# Patient Record
Sex: Female | Born: 1970 | Race: White | Hispanic: No | Marital: Married | Smoking: Current every day smoker
Health system: Southern US, Community
[De-identification: ages and names within clinical notes are randomized; demographics above are authoritative.]

## PROBLEM LIST (undated history)

## (undated) DIAGNOSIS — R51 Headache: Secondary | ICD-10-CM

## (undated) DIAGNOSIS — M069 Rheumatoid arthritis, unspecified: Secondary | ICD-10-CM

## (undated) DIAGNOSIS — M79601 Pain in right arm: Secondary | ICD-10-CM

## (undated) DIAGNOSIS — S060XAA Concussion with loss of consciousness status unknown, initial encounter: Secondary | ICD-10-CM

## (undated) DIAGNOSIS — K219 Gastro-esophageal reflux disease without esophagitis: Secondary | ICD-10-CM

## (undated) DIAGNOSIS — M79604 Pain in right leg: Secondary | ICD-10-CM

## (undated) DIAGNOSIS — R208 Other disturbances of skin sensation: Secondary | ICD-10-CM

## (undated) DIAGNOSIS — R519 Headache, unspecified: Secondary | ICD-10-CM

## (undated) DIAGNOSIS — M549 Dorsalgia, unspecified: Secondary | ICD-10-CM

## (undated) DIAGNOSIS — Z8711 Personal history of peptic ulcer disease: Secondary | ICD-10-CM

## (undated) DIAGNOSIS — Z8719 Personal history of other diseases of the digestive system: Secondary | ICD-10-CM

## (undated) DIAGNOSIS — M542 Cervicalgia: Secondary | ICD-10-CM

## (undated) HISTORY — DX: Pain in right leg: M79.604

## (undated) HISTORY — DX: Headache, unspecified: R51.9

## (undated) HISTORY — DX: Gastro-esophageal reflux disease without esophagitis: K21.9

## (undated) HISTORY — PX: OTHER SURGICAL HISTORY: SHX169

## (undated) HISTORY — DX: Concussion with loss of consciousness status unknown, initial encounter: S06.0XAA

## (undated) HISTORY — DX: Other disturbances of skin sensation: R20.8

## (undated) HISTORY — DX: Rheumatoid arthritis, unspecified: M06.9

## (undated) HISTORY — PX: COLONOSCOPY: SHX174

## (undated) HISTORY — DX: Headache: R51

## (undated) HISTORY — DX: Dorsalgia, unspecified: M54.9

## (undated) HISTORY — DX: Personal history of peptic ulcer disease: Z87.11

## (undated) HISTORY — DX: Cervicalgia: M54.2

## (undated) HISTORY — DX: Pain in right arm: M79.601

## (undated) HISTORY — DX: Personal history of other diseases of the digestive system: Z87.19

---

## 2013-06-24 ENCOUNTER — Encounter (INDEPENDENT_AMBULATORY_CARE_PROVIDER_SITE_OTHER): Payer: Self-pay

## 2013-06-24 ENCOUNTER — Ambulatory Visit (INDEPENDENT_AMBULATORY_CARE_PROVIDER_SITE_OTHER): Payer: BC Managed Care – PPO | Admitting: Neurology

## 2013-06-24 ENCOUNTER — Encounter: Payer: Self-pay | Admitting: Neurology

## 2013-06-24 VITALS — BP 111/68 | HR 70 | Ht 61.0 in | Wt 177.0 lb

## 2013-06-24 DIAGNOSIS — M79609 Pain in unspecified limb: Secondary | ICD-10-CM

## 2013-06-24 DIAGNOSIS — R208 Other disturbances of skin sensation: Secondary | ICD-10-CM | POA: Insufficient documentation

## 2013-06-24 DIAGNOSIS — M79604 Pain in right leg: Secondary | ICD-10-CM | POA: Insufficient documentation

## 2013-06-24 DIAGNOSIS — M549 Dorsalgia, unspecified: Secondary | ICD-10-CM | POA: Insufficient documentation

## 2013-06-24 DIAGNOSIS — R209 Unspecified disturbances of skin sensation: Secondary | ICD-10-CM

## 2013-06-24 MED ORDER — GABAPENTIN 300 MG PO CAPS: 600.0000 mg | ORAL_CAPSULE | Freq: Three times a day (TID) | ORAL | Status: DC

## 2013-06-24 NOTE — Progress Notes (Signed)
PATIENT: Maria Bailey DOB: June 03, 1970  HISTORICAL  Maria Bailey is a 43 years old right-handed Caucasian female, referred to orthopedic surgeon Dr. Jaynee Eagles, and primary care physician Dr. Cathi Roan for evaluation of right shoulder pain, and right-sided discomfort  She complains of 2 weeks history of sudden onset right-sided pain, in early may 2015, she woke up from sleep in pain, she complains of pain starting from her right hand, traveling through her right arm, to her right neck, later also noticed pain radiating to her right chest, right flank, then travelling through her right leg, she is in tears because of the pain during today's interview.  She has gradual worsening increased discomfort and pain over the past 2 weeks, complains needle prick pressure pain, she also has subjective weakness, she has trouble using utensils now by her right hand because of the pain and the weakness, has to use her left hand to feed herself  She was recently evaluated by Dr. Elizabeth Sauer office May in 18 2015, examination showed limited range of motion of cervical spine, she complains of decreased sensation on the right lateral face, and right chain, shoulder examination showed full range of motion, no signs of adhesive capsulitis, laboratory evaluation 4 CBC, ESR, C-reactive protein, ANA, rheumatoid factor, fibrin degrade d-dimer, I do not have anteroapically report, patient, only abnormality is elevated WBC, was not sure the etiology.  Sureness getting a prednisone Dosepak, also taking hydrocodone, gabapentin 300 mg 3 times a day, Flexeril, without helping her pain,  REVIEW OF SYSTEMS: Full 14 system review of systems performed and notable only for weight gain, chest pain, swelling in legs, blurry vision, double vision, shortness of breath, cough, anemia, increased thirst, joint pain, joint swelling, cramps, achy muscles, numbness, weakness, slurred speech, dizziness, restless  leg   ALLERGIES: Allergies  Allergen Reactions  . Amoxicillin   . Penicillins     HOME MEDICATIONS: No current outpatient prescriptions on file prior to visit.   No current facility-administered medications on file prior to visit.    PAST MEDICAL HISTORY: Past Medical History  Diagnosis Date  . Right arm pain   . Neck pain   . Face pain   . Right leg pain   . Back pain   . Dysesthesia   . History of stomach ulcers     PAST SURGICAL HISTORY: Past Surgical History  Procedure Laterality Date  . Laparoscopic colostomy    . C sections      x2    FAMILY HISTORY: Family History  Problem Relation Age of Onset  . Cancer Maternal Grandmother   . Cancer Maternal Grandfather   . Diabetes Father   . Fibromyalgia Sister     SOCIAL HISTORY:  History   Social History  . Marital Status: Unknown    Spouse Name: N/A    Number of Children: 2  . Years of Education: N/A   Occupational History  . Furniture preparation   Social History Main Topics  . Smoking status: Current Every Day Smoker  . Smokeless tobacco: Never Used  . Alcohol Use: 1.2 oz/week    2 Glasses of wine per week     Comment: rare  . Drug Use: No  . Sexual Activity: Not on file   Other Topics Concern  . Not on file   Social History Narrative  . No narrative on file     PHYSICAL EXAM   Filed Vitals:   06/24/13 0836  BP: 111/68  Pulse: 70  Height: _0  (1.549 m)  Weight: 177 lb (80.287 kg)    Not recorded    Body mass index is 33.46 kg/(m^2).   Generalized: In no acute distress  Neck: Supple, no carotid bruits   Cardiac: Regular rate rhythm  Pulmonary: Clear to auscultation bilaterally  Musculoskeletal: No deformity  Neurological examination  Mentation:  Tearful  during today's interview,oriented to time, place, history taking, and causual conversation  Cranial nerve II-XII: Pupils were equal round reactive to light. Extraocular movements were full.  Visual field were  full on confrontational test. Bilateral fundi were sharp.  Facial sensation and strength were normal. Hearing was intact to finger rubbing bilaterally. Uvula tongue midline.  Head turning and shoulder shrug and were normal and symmetric.Tongue protrusion into cheek strength was normal.  Motor: Variable effort during examination, complains of right arm pain, I do not see any significant weakness, she complains of tenderness of her right arm upon deep palpation  Sensory: Intact to fine touch, pinprick, preserved vibratory sensation, and proprioception at toes.  Coordination: Normal finger to nose, heel-to-shin bilaterally there was no truncal ataxia  Gait: Rising up from seated position without assistance, normal stance, without trunk ataxia, moderate stride, good arm swing, smooth turning, able to perform tiptoe, and heel walking without difficulty.   Romberg signs: Negative  Deep tendon reflexes: Brachioradialis 2/2, biceps 2/2, triceps 2/2, patellar 2/2, Achilles 2/2, plantar responses were flexor bilaterally.   DIAGNOSTIC DATA (LABS, IMAGING, TESTING) - I reviewed patient records, labs, notes, testing and imaging myself where available.  ASSESSMENT AND PLAN  [name] is a 43 y.o. female complains of  sudden onset of right-sided pain, essentially normal neurological examination,  1, with her complains of pain involving right face, whole right side of her body, localization could be left hemisphere, vs. right cervical cord, 2. Proceed with MRI of brain, cervical cord, 3. EMG nerve conduction study 4. Venous Doppler study of right arm to rule out right upper extremity DVT 5 increase her gabapentin to 300 mg 2 tablets 3 times a day.    Marcial Pacas, M.D. Ph.D.  [facility] [address], [place], [place] [address] [phone] but there is a

## 2013-07-01 ENCOUNTER — Encounter: Payer: BC Managed Care – PPO | Admitting: Neurology

## 2015-09-02 ENCOUNTER — Emergency Department (HOSPITAL_COMMUNITY): Payer: 59

## 2015-09-02 ENCOUNTER — Emergency Department (HOSPITAL_COMMUNITY)
Admission: EM | Admit: 2015-09-02 | Discharge: 2015-09-03 | Disposition: A | Discharge status: Home / Self Care | Payer: 59 | Attending: Emergency Medicine | Admitting: Emergency Medicine

## 2015-09-02 ENCOUNTER — Encounter (HOSPITAL_COMMUNITY): Payer: Self-pay

## 2015-09-02 DIAGNOSIS — S301XXA Contusion of abdominal wall, initial encounter: Secondary | ICD-10-CM | POA: Diagnosis not present

## 2015-09-02 DIAGNOSIS — Y9289 Other specified places as the place of occurrence of the external cause: Secondary | ICD-10-CM | POA: Diagnosis not present

## 2015-09-02 DIAGNOSIS — W228XXA Striking against or struck by other objects, initial encounter: Secondary | ICD-10-CM | POA: Diagnosis not present

## 2015-09-02 DIAGNOSIS — Y9389 Activity, other specified: Secondary | ICD-10-CM | POA: Insufficient documentation

## 2015-09-02 DIAGNOSIS — Z79899 Other long term (current) drug therapy: Secondary | ICD-10-CM | POA: Diagnosis not present

## 2015-09-02 DIAGNOSIS — Y990 Civilian activity done for income or pay: Secondary | ICD-10-CM | POA: Insufficient documentation

## 2015-09-02 DIAGNOSIS — F17200 Nicotine dependence, unspecified, uncomplicated: Secondary | ICD-10-CM | POA: Insufficient documentation

## 2015-09-02 DIAGNOSIS — S3991XA Unspecified injury of abdomen, initial encounter: Secondary | ICD-10-CM | POA: Diagnosis present

## 2015-09-02 LAB — CBC
HEMATOCRIT: 36.8 % (ref 36.0–46.0)
HEMOGLOBIN: 11.6 g/dL — AB (ref 12.0–15.0)
MCH: 27.4 pg (ref 26.0–34.0)
MCHC: 31.5 g/dL (ref 30.0–36.0)
MCV: 86.8 fL (ref 78.0–100.0)
Platelets: 289 10*3/uL (ref 150–400)
RBC: 4.24 MIL/uL (ref 3.87–5.11)
RDW: 14.5 % (ref 11.5–15.5)
WBC: 12.1 10*3/uL — AB (ref 4.0–10.5)

## 2015-09-02 LAB — LIPASE, BLOOD: Lipase: 32 U/L (ref 11–51)

## 2015-09-02 LAB — COMPREHENSIVE METABOLIC PANEL
ALT: 10 U/L — ABNORMAL LOW (ref 14–54)
ANION GAP: 6 (ref 5–15)
AST: 13 U/L — ABNORMAL LOW (ref 15–41)
Albumin: 3.4 g/dL — ABNORMAL LOW (ref 3.5–5.0)
Alkaline Phosphatase: 57 U/L (ref 38–126)
BILIRUBIN TOTAL: 0.2 mg/dL — AB (ref 0.3–1.2)
BUN: 13 mg/dL (ref 6–20)
CHLORIDE: 107 mmol/L (ref 101–111)
CO2: 24 mmol/L (ref 22–32)
Calcium: 9.4 mg/dL (ref 8.9–10.3)
Creatinine, Ser: 1.1 mg/dL — ABNORMAL HIGH (ref 0.44–1.00)
Glucose, Bld: 105 mg/dL — ABNORMAL HIGH (ref 65–99)
POTASSIUM: 4.1 mmol/L (ref 3.5–5.1)
Sodium: 137 mmol/L (ref 135–145)
TOTAL PROTEIN: 6 g/dL — AB (ref 6.5–8.1)

## 2015-09-02 MED ORDER — FENTANYL CITRATE (PF) 100 MCG/2ML IJ SOLN: 50.0000 ug | Freq: Once | INTRAMUSCULAR | Status: DC

## 2015-09-02 MED ORDER — IOPAMIDOL (ISOVUE-300) INJECTION 61%
INTRAVENOUS | Status: AC
  Administered 2015-09-02: 100 mL
  Filled 2015-09-02: qty 100

## 2015-09-02 NOTE — ED Provider Notes (Signed)
[facility]-EMERGENCY DEPT Provider Note   CSN: [id number] Arrival date & time: 09/02/15  1954  First Provider Contact:  First MD Initiated Contact with Patient 09/02/15 2136        History   Chief Complaint Chief Complaint  Patient presents with  . Abdominal Pain    HPI Maria Bailey is a 45 y.o. female.  The history is provided by the patient and a significant other. No language interpreter was used.  Abdominal Pain   This is a new problem. The current episode started yesterday. The problem occurs constantly. The problem has been gradually worsening. The pain is associated with trauma (var fell on abdomen). The pain is located in the epigastric region, LUQ and RUQ. The quality of the pain is sharp. The pain is moderate. Pertinent negatives include anorexia, fever, diarrhea, hematochezia, melena, nausea, vomiting and hematuria. The symptoms are aggravated by certain positions, deep breathing, activity and palpation. Relieved by: rest. Past workup comments: none. Her past medical history does not include PUD, gallstones or GERD.  Unclear how long patient was pinned under vehicle. Likely about 10 to 15 min.  Past Medical History:  Diagnosis Date  . Back pain   . Dysesthesia   . Face pain   . History of stomach ulcers   . Neck pain   . Right arm pain   . Right leg pain     Patient Active Problem List   Diagnosis Date Noted  . Right leg pain   . Back pain   . Dysesthesia     Past Surgical History:  Procedure Laterality Date  . c sections     x2  . COLONOSCOPY      OB History    No data available       Home Medications    Prior to Admission medications   Medication Sig Start Date End Date Taking? Authorizing Provider  acetaminophen (TYLENOL) 500 MG tablet Take 1,500 mg by mouth daily as needed (pain).   Yes Historical Provider, MD  pantoprazole (PROTONIX) 40 MG tablet Take 40 mg by mouth 2 (two) times daily.   Yes Historical Provider, MD  PRESCRIPTION MEDICATION  Take 1 tablet by mouth See admin instructions. Take 1 tablet by mouth daily on work days - low dose diet pill to keep her awake   Yes Historical Provider, MD  gabapentin (NEURONTIN) 300 MG capsule Take 2 capsules (600 mg total) by mouth 3 (three) times daily. Patient not taking: Reported on 09/02/2015 06/24/13   Levert Feinstein, MD    Family History Family History  Problem Relation Age of Onset  . Diabetes Father   . Cancer Maternal Grandmother   . Cancer Maternal Grandfather   . Fibromyalgia Sister     Social History Social History  Substance Use Topics  . Smoking status: Current Every Day Smoker  . Smokeless tobacco: Never Used  . Alcohol use 1.2 oz/week    2 Glasses of wine per week     Comment: rare     Allergies   Amoxicillin and Penicillins   Review of Systems Review of Systems  Constitutional: Negative for fever.  HENT: Negative for trouble swallowing.   Eyes: Negative for visual disturbance.  Respiratory: Negative for shortness of breath.   Cardiovascular: Negative for chest pain.  Gastrointestinal: Positive for abdominal distention and abdominal pain. Negative for anorexia, diarrhea, hematochezia, melena, nausea and vomiting.  Genitourinary: Negative for hematuria.  Musculoskeletal: Positive for back pain (chronic, no new pain).  Skin:  Negative for pallor.  Neurological: Negative for syncope and light-headedness.  Hematological: Does not bruise/bleed easily.  Psychiatric/Behavioral: Negative for agitation and confusion.     Physical Exam Updated Vital Signs BP (!) 111/53 (BP Location: Right Arm)   Pulse 67   Temp 98.5 F (36.9 C) (Oral)   Resp 14   Ht 5\' 2"  (1.575 m)   Wt 83.9 kg   SpO2 99%   BMI 33.84 kg/m   Physical Exam  Constitutional: She is oriented to person, place, and time. She appears well-developed and well-nourished. She appears distressed (mild to mod).  HENT:  Head: Normocephalic and atraumatic.  Eyes: Conjunctivae are normal. Right eye  exhibits no discharge. Left eye exhibits no discharge.  Neck: Normal range of motion. Neck supple. No tracheal deviation present.  Cardiovascular: Normal rate, regular rhythm, normal heart sounds and intact distal pulses.   No murmur heard. Pulmonary/Chest: Effort normal and breath sounds normal. No stridor. No respiratory distress. She has no wheezes. She has no rales.  Abdominal: Soft. Bowel sounds are normal. She exhibits distension (mild). She exhibits no mass. There is tenderness (along upper half of abdomen). There is no rebound and no guarding.  Musculoskeletal: She exhibits no edema, tenderness or deformity.  Neurological: She is alert and oriented to person, place, and time.  Skin: Skin is warm and dry. Capillary refill takes less than 2 seconds. She is not diaphoretic.  Psychiatric: She has a normal mood and affect. Her behavior is normal. Judgment and thought content normal.  Nursing note and vitals reviewed.    ED Treatments / Results  Labs (all labs ordered are listed, but only abnormal results are displayed) Labs Reviewed  COMPREHENSIVE METABOLIC PANEL - Abnormal; Notable for the following:       Result Value   Glucose, Bld 105 (*)    Creatinine, Ser 1.10 (*)    Total Protein 6.0 (*)    Albumin 3.4 (*)    AST 13 (*)    ALT 10 (*)    Total Bilirubin 0.2 (*)    All other components within normal limits  CBC - Abnormal; Notable for the following:    WBC 12.1 (*)    Hemoglobin 11.6 (*)    All other components within normal limits  LIPASE, BLOOD    EKG  EKG Interpretation None       Radiology Ct Abdomen Pelvis W Contrast  Result Date: 09/02/2015 CLINICAL DATA:  Abdominal pain after injury. Patient was working under car, jack fell and car landed on mid abdomen, on abdomen for 2 minutes. EXAM: CT ABDOMEN AND PELVIS WITH CONTRAST TECHNIQUE: Multidetector CT imaging of the abdomen and pelvis was performed using the standard protocol following bolus administration of  intravenous contrast. CONTRAST:  ISOVUE-300 IOPAMIDOL (ISOVUE-300) INJECTION 61% COMPARISON:  None. FINDINGS: Lower chest: The included lung bases are clear. No pleural fluid. No pneumothorax. Included ribs are intact. Liver: Homogeneous attenuation without traumatic injury. No focal lesion. Hepatobiliary: Gallbladder physiologically distended, no calcified stone. No biliary dilatation. Pancreas: No ductal dilatation or inflammation. No evidence of traumatic injury. Spleen: Normal. Homogeneous in density, no traumatic injury. Small splenule inferiorly. Adrenal glands: No hemorrhage or nodule. Kidneys: Symmetric renal enhancement. No hydronephrosis. No traumatic injury. Subcentimeter hypodensity in the lower right kidney, likely simple cyst, too small to accurately characterize. Stomach/Bowel: Stomach physiologically distended. There are no dilated or thickened small bowel loops. Small volume of stool throughout the colon without colonic wall thickening. Minimal colonic diverticulosis without diverticulitis.  The appendix is normal. No mesenteric hematoma. Vascular/Lymphatic: No retroperitoneal fluid or adenopathy. Abdominal aorta is normal in caliber. Reproductive: Uterus slightly bulbous in appearance, likely due to fibroids. Cystic change in both ovaries, left greater than right. Bladder: Physiologically distended, no wall thickening. Other: Faint subcutaneous edema in the anterior abdominal wall. No confluent hematoma. No free air, free fluid, or intra-abdominal fluid collection. Musculoskeletal: There are no acute or suspicious osseous abnormalities. No fracture of the bony pelvis without lumbar spine. Scattered bone islands. IMPRESSION: 1. Faint soft tissue edema in the anterior abdominal wall subcutaneous tissues. No confluent hematoma. 2. No additional acute traumatic injury to the abdomen or pelvis. 3. Incidental nontraumatic findings of colonic diverticulosis, questionable uterine fibroids and ovarian  cysts. Electronically Signed   By: Rubye Oaks M.D.   On: 09/02/2015 23:57   Procedures Procedures (including critical care time)  Medications Ordered in ED Medications  iopamidol (ISOVUE-300) 61 % injection (100 mLs  Contrast Given 09/02/15 2324)     Initial Impression / Assessment and Plan / ED Course  I have reviewed the triage vital signs and the nursing notes.  Pertinent labs & imaging results that were available during my care of the patient were reviewed by me and considered in my medical decision making (see chart for details).  Clinical Course   Patient presented after having an injury from a significant mechanism. HDS on presentation and no signs of shock noted. ABCs intact. Patient was concerned because pain was ongoing today. Has not had any significant associated symptoms. Abd is tender along upper portion but does not have any signs consistent with peritonitis (including no bilat positive heal tap tests). Basic blood work was obtain to rule out anemia if patient had had bleeding since injury (patient had had increased abd distention but no signs of retroperitoneal bleeding were seen on physical exam. FAST was performed and was negative. LFTs and lipase were WNL. Due to mechanism and duration of pinning, ct with contrast of abd/pelvis was obtained. Soft tissue contusions were seen but no extravasation was noted and no solid organ injuries were seen either. No appreciable free fluid either. Results were discussed with patient and was recommended to managed pain with NSAIDs and rest. Work note provided to limit physical exercise. Usual and customary return precautions discussed. Encouraged follow up with PCP to monitor for improvement. All questions answered. Patient agrees with plan.   Final Clinical Impressions(s) / ED Diagnoses   Final diagnoses:  Hematoma of abdominal wall, initial encounter    New Prescriptions Discharge Medication List as of 09/03/2015 12:41 AM        Maretta Bees, MD 09/04/15 0044    Melene Plan, DO 09/04/15 1510

## 2015-09-02 NOTE — ED Triage Notes (Signed)
Pt states was changing oil underneath car. States jack moved and car fell on abdomen. Pt with + bowel sounds. Denies any other trauma/injury. Pt complaining of central abdominal pain. No obvious bruising or swelling noted.

## 2015-09-06 ENCOUNTER — Emergency Department (HOSPITAL_COMMUNITY)
Admission: EM | Admit: 2015-09-06 | Discharge: 2015-09-07 | Disposition: A | Discharge status: Home / Self Care | Payer: 59 | Attending: Emergency Medicine | Admitting: Emergency Medicine

## 2015-09-06 ENCOUNTER — Encounter (HOSPITAL_COMMUNITY): Payer: Self-pay | Admitting: Emergency Medicine

## 2015-09-06 DIAGNOSIS — Y999 Unspecified external cause status: Secondary | ICD-10-CM | POA: Diagnosis not present

## 2015-09-06 DIAGNOSIS — Y929 Unspecified place or not applicable: Secondary | ICD-10-CM | POA: Insufficient documentation

## 2015-09-06 DIAGNOSIS — R110 Nausea: Secondary | ICD-10-CM | POA: Diagnosis not present

## 2015-09-06 DIAGNOSIS — R1010 Upper abdominal pain, unspecified: Secondary | ICD-10-CM

## 2015-09-06 DIAGNOSIS — F17200 Nicotine dependence, unspecified, uncomplicated: Secondary | ICD-10-CM | POA: Insufficient documentation

## 2015-09-06 DIAGNOSIS — R1084 Generalized abdominal pain: Secondary | ICD-10-CM

## 2015-09-06 DIAGNOSIS — Y9389 Activity, other specified: Secondary | ICD-10-CM | POA: Insufficient documentation

## 2015-09-06 DIAGNOSIS — S301XXA Contusion of abdominal wall, initial encounter: Secondary | ICD-10-CM | POA: Insufficient documentation

## 2015-09-06 DIAGNOSIS — S3991XA Unspecified injury of abdomen, initial encounter: Secondary | ICD-10-CM | POA: Diagnosis present

## 2015-09-06 DIAGNOSIS — W208XXA Other cause of strike by thrown, projected or falling object, initial encounter: Secondary | ICD-10-CM | POA: Insufficient documentation

## 2015-09-06 MED ORDER — OXYCODONE-ACETAMINOPHEN 5-325 MG PO TABS
1.0000 | ORAL_TABLET | ORAL | Status: DC | PRN
  Administered 2015-09-06: 1 via ORAL

## 2015-09-06 MED ORDER — OXYCODONE-ACETAMINOPHEN 5-325 MG PO TABS
ORAL_TABLET | ORAL | Status: DC
Start: 2015-09-06 — End: 2015-09-07
  Filled 2015-09-06: qty 1

## 2015-09-06 NOTE — ED Triage Notes (Addendum)
The patient said she was trying to change the oil, the jack slipped and the car fell on her.  This happened on Saturday.  She was seen here on Sunday and was discharged home on Sunday.  The patient was told if the pain got worse and she continued to swell she should come to the ED.  She rates her pain 9/10.  She has taken tylenol and it is not working.  Patient is distended and feels a lot of pressure.  She has had BMs and denies any blood.

## 2015-09-07 ENCOUNTER — Emergency Department (HOSPITAL_COMMUNITY): Payer: 59

## 2015-09-07 LAB — CBC WITH DIFFERENTIAL/PLATELET
Basophils Absolute: 0 10*3/uL (ref 0.0–0.1)
Basophils Relative: 0 %
EOS ABS: 0.1 10*3/uL (ref 0.0–0.7)
Eosinophils Relative: 2 %
HEMATOCRIT: 36.5 % (ref 36.0–46.0)
HEMOGLOBIN: 11.6 g/dL — AB (ref 12.0–15.0)
LYMPHS ABS: 2.5 10*3/uL (ref 0.7–4.0)
LYMPHS PCT: 28 %
MCH: 27.6 pg (ref 26.0–34.0)
MCHC: 31.8 g/dL (ref 30.0–36.0)
MCV: 86.9 fL (ref 78.0–100.0)
MONOS PCT: 3 %
Monocytes Absolute: 0.3 10*3/uL (ref 0.1–1.0)
NEUTROS PCT: 67 %
Neutro Abs: 6 10*3/uL (ref 1.7–7.7)
Platelets: 266 10*3/uL (ref 150–400)
RBC: 4.2 MIL/uL (ref 3.87–5.11)
RDW: 14.5 % (ref 11.5–15.5)
WBC: 8.9 10*3/uL (ref 4.0–10.5)

## 2015-09-07 LAB — URINALYSIS, ROUTINE W REFLEX MICROSCOPIC
BILIRUBIN URINE: NEGATIVE
GLUCOSE, UA: NEGATIVE mg/dL
HGB URINE DIPSTICK: NEGATIVE
Ketones, ur: NEGATIVE mg/dL
Leukocytes, UA: NEGATIVE
Nitrite: NEGATIVE
PH: 5.5 (ref 5.0–8.0)
Protein, ur: NEGATIVE mg/dL
SPECIFIC GRAVITY, URINE: 1.019 (ref 1.005–1.030)

## 2015-09-07 LAB — COMPREHENSIVE METABOLIC PANEL
ALBUMIN: 3.4 g/dL — AB (ref 3.5–5.0)
ALT: 8 U/L — AB (ref 14–54)
AST: 13 U/L — AB (ref 15–41)
Alkaline Phosphatase: 52 U/L (ref 38–126)
Anion gap: 6 (ref 5–15)
BILIRUBIN TOTAL: 0.5 mg/dL (ref 0.3–1.2)
BUN: 13 mg/dL (ref 6–20)
CHLORIDE: 110 mmol/L (ref 101–111)
CO2: 22 mmol/L (ref 22–32)
CREATININE: 1.07 mg/dL — AB (ref 0.44–1.00)
Calcium: 8.2 mg/dL — ABNORMAL LOW (ref 8.9–10.3)
GFR calc Af Amer: >60 mL/min (ref >60)
GFR calc non Af Amer: >60 mL/min (ref >60)
GLUCOSE: 82 mg/dL (ref 65–99)
POTASSIUM: 3.7 mmol/L (ref 3.5–5.1)
Sodium: 138 mmol/L (ref 135–145)
Total Protein: 5.9 g/dL — ABNORMAL LOW (ref 6.5–8.1)

## 2015-09-07 LAB — POC URINE PREG, ED: Preg Test, Ur: NEGATIVE

## 2015-09-07 LAB — I-STAT CG4 LACTIC ACID, ED: Lactic Acid, Venous: <0.3 mmol/L — ABNORMAL LOW (ref 0.5–1.9)

## 2015-09-07 MED ORDER — MORPHINE SULFATE (PF) 4 MG/ML IV SOLN
4.0000 mg | Freq: Once | INTRAVENOUS | Status: AC
  Administered 2015-09-07: 4 mg via INTRAVENOUS
  Filled 2015-09-07: qty 1

## 2015-09-07 MED ORDER — ONDANSETRON HCL 4 MG/2ML IJ SOLN
4.0000 mg | Freq: Once | INTRAMUSCULAR | Status: AC
Start: 2015-09-07 — End: 2015-09-07
  Administered 2015-09-07: 4 mg via INTRAVENOUS
  Filled 2015-09-07: qty 2

## 2015-09-07 MED ORDER — SODIUM CHLORIDE 0.9 % IV BOLUS (SEPSIS)
1000.0000 mL | Freq: Once | INTRAVENOUS | Status: AC
  Administered 2015-09-07: 1000 mL via INTRAVENOUS

## 2015-09-07 MED ORDER — IOPAMIDOL (ISOVUE-300) INJECTION 61%
INTRAVENOUS | Status: AC
  Administered 2015-09-07: 100 mL
  Filled 2015-09-07: qty 100

## 2015-09-07 MED ORDER — ONDANSETRON HCL 4 MG PO TABS: 4.0000 mg | ORAL_TABLET | Freq: Four times a day (QID) | ORAL | 0 refills | Status: DC

## 2015-09-07 MED ORDER — CYCLOBENZAPRINE HCL 10 MG PO TABS: 10.0000 mg | ORAL_TABLET | Freq: Two times a day (BID) | ORAL | 0 refills | Status: DC | PRN

## 2015-09-07 MED ORDER — HYDROMORPHONE HCL 1 MG/ML IJ SOLN
1.0000 mg | Freq: Once | INTRAMUSCULAR | Status: AC
  Administered 2015-09-07: 1 mg via INTRAVENOUS
  Filled 2015-09-07: qty 1

## 2015-09-07 NOTE — ED Provider Notes (Signed)
Medical screening examination/treatment/procedure(s) were conducted as a shared visit with non-physician practitioner(s) and myself.  I personally evaluated the patient during the encounter.   EKG Interpretation None      Patient with increasing abdominal distention and increasing abdominal pain despite being 6 days out from her injury.  The mechanism is concerning given crush injury by car.  The distention is probably the component that is most concerning.  She continues to have nausea as well as some loose stools.  No blood noted in her stool.  CT scan again tonight demonstrates no intra-abdominal pathology.  Her white blood cell count is normal.  I question whether or not she could be developing subacute intra-abdominal hypertension.  At this time she does point doesn't have acute abdominal compartment syndrome however I have no great alternative pathology for why she is feeling worse and she seems extremely tender on examination.  I spoke with Dr. Gaynelle Adu of general surgery/trauma surgery whose team will evaluate the patient in the emergency department.   Azalia Bilis, MD 09/07/15 [phone]

## 2015-09-07 NOTE — ED Provider Notes (Signed)
[facility]-EMERGENCY DEPT Provider Note   CSN: [id number] Arrival date & time: 09/06/15  2010  First Provider Contact:  First MD Initiated Contact with Patient 09/07/15 0007        History   Chief Complaint Chief Complaint  Patient presents with  . Abdominal Pain    The patient said she was trying to change the oil, the jack slipped and the car fell on her.  This happened on Saturday.  She was seen here on Sunday and was discharged home on Sunday.      HPI Maria Bailey is a 45 y.o. female.  HPI    Maria Bailey is a 45 y.o. female, with a history of Peptic ulcers, presenting to the ED with upper abdominal pain and distension, increasing over the last four or five days. Pain is severe, described as intermittently pressure and sharp, nonradiating. Pain originated on July 29 when the patient was underneath a car changing the oil when the jack slipped and the car fell on top of her. Patient was seen the following day in the ED with some mild abdominal wall edema noted on CT. Pt also complains of nausea. States her abdomen has doubled in size with the largest increase in swelling occurring over the past 24 hours. Still able to have BM, urinate, and pass gas. Denies fever/chills, vomiting, hematochezia/melena, or any other complaints.      Past Medical History:  Diagnosis Date  . Back pain   . Dysesthesia   . Face pain   . History of stomach ulcers   . Neck pain   . Right arm pain   . Right leg pain     Patient Active Problem List   Diagnosis Date Noted  . Right leg pain   . Back pain   . Dysesthesia     Past Surgical History:  Procedure Laterality Date  . c sections     x2  . COLONOSCOPY      OB History    No data available       Home Medications    Prior to Admission medications   Medication Sig Start Date End Date Taking? Authorizing Provider  acetaminophen (TYLENOL) 500 MG tablet Take 1,500 mg by mouth daily as needed (pain).   Yes Historical Provider, MD    buPROPion (WELLBUTRIN XL) 150 MG 24 hr tablet Take 150 mg by mouth daily.   Yes Historical Provider, MD  pantoprazole (PROTONIX) 40 MG tablet Take 40 mg by mouth 2 (two) times daily.   Yes Historical Provider, MD    Family History Family History  Problem Relation Age of Onset  . Diabetes Father   . Cancer Maternal Grandmother   . Cancer Maternal Grandfather   . Fibromyalgia Sister     Social History Social History  Substance Use Topics  . Smoking status: Current Every Day Smoker  . Smokeless tobacco: Never Used  . Alcohol use 1.2 oz/week    2 Glasses of wine per week     Comment: rare     Allergies   Amoxicillin and Penicillins   Review of Systems Review of Systems  Constitutional: Negative for chills and fever.  Gastrointestinal: Positive for abdominal distention, abdominal pain and nausea. Negative for blood in stool and vomiting.  Genitourinary: Negative for dysuria and hematuria.  Musculoskeletal: Negative for back pain.  Skin: Negative for wound.  All other systems reviewed and are negative.    Physical Exam Updated Vital Signs BP 126/84 (BP Location:  Right Arm)   Pulse 81   Temp 98.5 F (36.9 C) (Oral)   Resp 20   LMP 08/26/2015   SpO2 97%   Physical Exam  Constitutional: She appears well-developed and well-nourished. No distress.  HENT:  Head: Normocephalic and atraumatic.  Eyes: Conjunctivae are normal.  Neck: Neck supple.  Cardiovascular: Normal rate, regular rhythm, normal heart sounds and intact distal pulses.   Pulmonary/Chest: Effort normal and breath sounds normal. No respiratory distress.  Abdominal: Soft. Bowel sounds are normal. She exhibits distension. There is tenderness (exquisite) in the right upper quadrant, epigastric area and left upper quadrant. There is no guarding.  Slight bruising to upper abdomen.  Musculoskeletal: She exhibits no edema or tenderness.  Full ROM in all extremities and spine. No midline spinal tenderness.    Lymphadenopathy:    She has no cervical adenopathy.  Neurological: She is alert. She has normal reflexes.  No sensory deficits in the lower extremities. Strength 5 out of 5.  Skin: Skin is warm and dry. She is not diaphoretic.  Psychiatric: She has a normal mood and affect. Her behavior is normal.  Nursing note and vitals reviewed.    ED Treatments / Results  Labs (all labs ordered are listed, but only abnormal results are displayed) Labs Reviewed  COMPREHENSIVE METABOLIC PANEL - Abnormal; Notable for the following:       Result Value   Creatinine, Ser 1.07 (*)    Calcium 8.2 (*)    Total Protein 5.9 (*)    Albumin 3.4 (*)    AST 13 (*)    ALT 8 (*)    All other components within normal limits  CBC WITH DIFFERENTIAL/PLATELET - Abnormal; Notable for the following:    Hemoglobin 11.6 (*)    All other components within normal limits  I-STAT CG4 LACTIC ACID, ED - Abnormal; Notable for the following:    Lactic Acid, Venous <0.30 (*)    All other components within normal limits  URINALYSIS, ROUTINE W REFLEX MICROSCOPIC (NOT AT [facility])  POC URINE PREG, ED    EKG  EKG Interpretation None      Hemoglobin  Date Value Ref Range Status  09/07/2015 11.6 (L) 12.0 - 15.0 g/dL Final  81/15/7262 03.5 (L) 12.0 - 15.0 g/dL Final    Radiology Ct Abdomen Pelvis W Contrast  Result Date: 09/07/2015 CLINICAL DATA:  Car fell on abdomen 6 days ago, with worsening pain and distention. EXAM: CT ABDOMEN AND PELVIS WITH CONTRAST TECHNIQUE: Multidetector CT imaging of the abdomen and pelvis was performed using the standard protocol following bolus administration of intravenous contrast. CONTRAST:  ISOVUE-300 IOPAMIDOL (ISOVUE-300) INJECTION 61% COMPARISON:  CT abdomen and pelvis September 02, 2015. FINDINGS: LUNG BASES: Hazy ground-glass opacities in lung bases most compatible with atelectasis. Heart size is normal. No pericardial effusions. SOLID ORGANS: The liver, spleen, gallbladder, pancreas and  adrenal glands are unremarkable. GASTROINTESTINAL TRACT: The stomach, small and large bowel are normal in course and caliber without inflammatory changes. Mild sigmoid diverticulosis. Normal appendix. KIDNEYS/ URINARY TRACT: Kidneys are orthotopic, demonstrating symmetric enhancement. No nephrolithiasis, hydronephrosis or solid renal masses. Too small to characterize hypodensities in the kidneys bilaterally. The unopacified ureters are normal in course and caliber. Delayed imaging through the kidneys demonstrates symmetric prompt contrast excretion within the proximal urinary collecting system. Urinary bladder is partially distended and unremarkable. PERITONEUM/RETROPERITONEUM: Aortoiliac vessels are normal in course and caliber. No lymphadenopathy by CT size criteria. Small lymph nodes at porta hepatis are likely reactive.  Mildly lobulated uterine contour suggest leiomyomata. No intraperitoneal free fluid nor free air. SOFT TISSUE/OSSEOUS STRUCTURES: Mild residual anterior abdominal wall subcutaneous fat stranding, no focal fluid collection. IMPRESSION: Faint residual anterior abdominal wall contusion. No acute intra-abdominal or pelvic process. Electronically Signed   By: Awilda Metro M.D.   On: 09/07/2015 03:44    Procedures Procedures (including critical care time)  Medications Ordered in ED Medications  oxyCODONE-acetaminophen (PERCOCET/ROXICET) 5-325 MG per tablet 1 tablet (1 tablet Oral Given 09/06/15 2023)  morphine 4 MG/ML injection 4 mg (4 mg Intravenous Given 09/07/15 0041)  ondansetron (ZOFRAN) injection 4 mg (4 mg Intravenous Given 09/07/15 0041)  sodium chloride 0.9 % bolus 1,000 mL (0 mLs Intravenous Stopped 09/07/15 0201)  HYDROmorphone (DILAUDID) injection 1 mg (1 mg Intravenous Given 09/07/15 0121)  sodium chloride 0.9 % bolus 1,000 mL (1,000 mLs Intravenous New Bag/Given 09/07/15 0258)  iopamidol (ISOVUE-300) 61 % injection (100 mLs  Contrast Given 09/07/15 0318)     Initial Impression /  Assessment and Plan / ED Course  I have reviewed the triage vital signs and the nursing notes.  Pertinent labs & imaging results that were available during my care of the patient were reviewed by me and considered in my medical decision making (see chart for details).  Clinical Course    Maria Bailey presents with abdominal pain Increasing over the last 4 days following an abdominal injury.  Findings and plan of care discussed with Azalia Bilis, MD. Dr. Patria Mane personally evaluated and examined this patient.  The concern for this patient is that she is at risk for abdominal compartment syndrome. The concerning factor being increasing pain with progressive abdominal distention. Upon reassessment, patient states her pain is well controlled. No notable progression in the abdominal distention. CT shows abdominal wall contusion. General surgery consult due to mechanism, distention, and amount of tenderness on exam. 4:57 AM Dr. Patria Mane spoke with Dr. Andrey Campanile, general surgeon, who agreed to have someone from his team see the patient in the ED later this morning. 6:48 AM End of shift patient care handoff report given to [employer], PA-C. Plan: Patient to be assessed by a Trauma team provider later this morning. Dispo per their instructions.  Vitals:   09/07/15 0145 09/07/15 0200 09/07/15 0215 09/07/15 0245  BP: 109/79 123/85 107/57 109/55  Pulse: 75 87 70 62  Resp: Temp:      TempSrc:      SpO2: 99% 96% (!) 87% 90%   Vitals:   09/07/15 0200 09/07/15 0215 09/07/15 0245 09/07/15 0509  BP: 123/85 107/57 109/55 111/66  Pulse: 87 70 62 60  Resp: Temp:      TempSrc:      SpO2: 96% (!) 87% 90% 98%       Final Clinical Impressions(s) / ED Diagnoses   Final diagnoses:  Pain of upper abdomen    New Prescriptions New Prescriptions   No medications on file     Concepcion Living 09/07/15 0650    Azalia Bilis, MD 09/07/15 [phone]

## 2015-09-07 NOTE — ED Notes (Signed)
Patient transported to Ct. Pt in NAD at this time.

## 2015-09-07 NOTE — Consult Note (Signed)
Reason for Consult: Abdominal wall contusion Referring Physician: Osie Merkin is an 44 y.o. female.  HPI: Jarielys was removing the oil p.m. from a car which was up on a jack last Saturday, July 29. The [place] in the car fell down striking her in the abdomen. She had immediate pain in the area but it subsided after 30 minutes or so. It did not completely resolve and she was more sore the following day prompting a trip to the emergency department for evaluation. She underwent a workup including CT scan abdomen and pelvis which showed a faint abdominal wall contusion but was otherwise negative. Since that time, she has had ongoing abdominal soreness and intermittent nausea. She contacted her primary care physician who referred her back to the emergency department. She returned last night and underwent a further evaluation which included laboratory studies which were unremarkable. Also included a repeat CT scan of her abdomen and pelvis which shows trace abdominal wall contusion and otherwise no acute traumatic abnormalities. We are asked to evaluate her for her ongoing pain. Last bowel movement was yesterday and was loose without blood.  Past Medical History:  Diagnosis Date  . Back pain   . Dysesthesia   . Face pain   . History of stomach ulcers   . Neck pain   . Right arm pain   . Right leg pain     Past Surgical History:  Procedure Laterality Date  . c sections     x2  . COLONOSCOPY      Family History  Problem Relation Age of Onset  . Diabetes Father   . Cancer Maternal Grandmother   . Cancer Maternal Grandfather   . Fibromyalgia Sister     Social History:  reports that she has been smoking.  She has never used smokeless tobacco. She reports that she drinks about 1.2 oz of alcohol per week . She reports that she does not use drugs.  Allergies:  Allergies  Allergen Reactions  . Amoxicillin Anaphylaxis and Hives  . Penicillins Anaphylaxis and Hives    Has  patient had a PCN reaction causing immediate rash, facial/tongue/throat swelling, SOB or lightheadedness with hypotension: Yes Has patient had a PCN reaction causing severe rash involving mucus membranes or skin necrosis: No Has patient had a PCN reaction that required hospitalization 10 hr hospital visit Has patient had a PCN reaction occurring within the last 10 years: No If all of the above answers are "NO", then may proceed with Cephalosporin use.    Medications: Prior to Admission:  (Not in a hospital admission)  Results for orders placed or performed during the hospital encounter of 09/06/15 (from the past 48 hour(s))  Comprehensive metabolic panel     Status: Abnormal   Collection Time: 09/07/15  1:42 AM  Result Value Ref Range   Sodium 138 135 - 145 mmol/L   Potassium 3.7 3.5 - 5.1 mmol/L   Chloride 110 101 - 111 mmol/L   CO2 22 22 - 32 mmol/L   Glucose, Bld 82 65 - 99 mg/dL   BUN 13 6 - 20 mg/dL   Creatinine, Ser 1.07 (H) 0.44 - 1.00 mg/dL   Calcium 8.2 (L) 8.9 - 10.3 mg/dL   Total Protein 5.9 (L) 6.5 - 8.1 g/dL   Albumin 3.4 (L) 3.5 - 5.0 g/dL   AST 13 (L) 15 - 41 U/L   ALT 8 (L) 14 - 54 U/L   Alkaline Phosphatase 52 38 - 126  U/L   Total Bilirubin 0.5 0.3 - 1.2 mg/dL   GFR calc non Af Amer >60 >60 mL/min   GFR calc Af Amer >60 >60 mL/min    Comment: (NOTE) The eGFR has been calculated using the CKD EPI equation. This calculation has not been validated in all clinical situations. eGFR's persistently <60 mL/min signify possible Chronic Kidney Disease.    Anion gap 6 5 - 15  CBC with Differential     Status: Abnormal   Collection Time: 09/07/15  1:42 AM  Result Value Ref Range   WBC 8.9 4.0 - 10.5 K/uL   RBC 4.20 3.87 - 5.11 MIL/uL   Hemoglobin 11.6 (L) 12.0 - 15.0 g/dL   HCT 36.5 36.0 - 46.0 %   MCV 86.9 78.0 - 100.0 fL   MCH 27.6 26.0 - 34.0 pg   MCHC 31.8 30.0 - 36.0 g/dL   RDW 14.5 11.5 - 15.5 %   Platelets 266 150 - 400 K/uL   Neutrophils Relative % 67 %     Neutro Abs 6.0 1.7 - 7.7 K/uL   Lymphocytes Relative 28 %   Lymphs Abs 2.5 0.7 - 4.0 K/uL   Monocytes Relative 3 %   Monocytes Absolute 0.3 0.1 - 1.0 K/uL   Eosinophils Relative 2 %   Eosinophils Absolute 0.1 0.0 - 0.7 K/uL   Basophils Relative 0 %   Basophils Absolute 0.0 0.0 - 0.1 K/uL  POC urine preg, ED     Status: None   Collection Time: 09/07/15  2:07 AM  Result Value Ref Range   Preg Test, Ur NEGATIVE NEGATIVE    Comment:        THE SENSITIVITY OF THIS METHODOLOGY IS >24 mIU/mL   Urinalysis, Routine w reflex microscopic     Status: None   Collection Time: 09/07/15  2:41 AM  Result Value Ref Range   Color, Urine YELLOW YELLOW   APPearance CLEAR CLEAR   Specific Gravity, Urine 1.019 1.005 - 1.030   pH 5.5 5.0 - 8.0   Glucose, UA NEGATIVE NEGATIVE mg/dL   Hgb urine dipstick NEGATIVE NEGATIVE   Bilirubin Urine NEGATIVE NEGATIVE   Ketones, ur NEGATIVE NEGATIVE mg/dL   Protein, ur NEGATIVE NEGATIVE mg/dL   Nitrite NEGATIVE NEGATIVE   Leukocytes, UA NEGATIVE NEGATIVE    Comment: MICROSCOPIC NOT DONE ON URINES WITH NEGATIVE PROTEIN, BLOOD, LEUKOCYTES, NITRITE, OR GLUCOSE <1000 mg/dL.  I-Stat CG4 Lactic Acid, ED     Status: Abnormal   Collection Time: 09/07/15  5:02 AM  Result Value Ref Range   Lactic Acid, Venous <0.30 (L) 0.5 - 1.9 mmol/L    Ct Abdomen Pelvis W Contrast  Result Date: 09/07/2015 CLINICAL DATA:  Car fell on abdomen 6 days ago, with worsening pain and distention. EXAM: CT ABDOMEN AND PELVIS WITH CONTRAST TECHNIQUE: Multidetector CT imaging of the abdomen and pelvis was performed using the standard protocol following bolus administration of intravenous contrast. CONTRAST:  165m ISOVUE-300 IOPAMIDOL (ISOVUE-300) INJECTION 61% COMPARISON:  CT abdomen and pelvis September 02, 2015. FINDINGS: LUNG BASES: Hazy ground-glass opacities in lung bases most compatible with atelectasis. Heart size is normal. No pericardial effusions. SOLID ORGANS: The liver, spleen,  gallbladder, pancreas and adrenal glands are unremarkable. GASTROINTESTINAL TRACT: The stomach, small and large bowel are normal in course and caliber without inflammatory changes. Mild sigmoid diverticulosis. Normal appendix. KIDNEYS/ URINARY TRACT: Kidneys are orthotopic, demonstrating symmetric enhancement. No nephrolithiasis, hydronephrosis or solid renal masses. Too small to characterize hypodensities in the kidneys  bilaterally. The unopacified ureters are normal in course and caliber. Delayed imaging through the kidneys demonstrates symmetric prompt contrast excretion within the proximal urinary collecting system. Urinary bladder is partially distended and unremarkable. PERITONEUM/RETROPERITONEUM: Aortoiliac vessels are normal in course and caliber. No lymphadenopathy by CT size criteria. Small lymph nodes at porta hepatis are likely reactive. Mildly lobulated uterine contour suggest leiomyomata. No intraperitoneal free fluid nor free air. SOFT TISSUE/OSSEOUS STRUCTURES: Mild residual anterior abdominal wall subcutaneous fat stranding, no focal fluid collection. IMPRESSION: Faint residual anterior abdominal wall contusion. No acute intra-abdominal or pelvic process. Electronically Signed   By: Elon Alas M.D.   On: 09/07/2015 03:44    Review of Systems  Constitutional: Negative for fever.  Eyes: Negative for blurred vision and double vision.  Respiratory: Negative for cough and shortness of breath.   Cardiovascular: Negative for chest pain.  Gastrointestinal: Positive for abdominal pain, diarrhea and nausea. Negative for blood in stool, melena and vomiting.  Genitourinary: Negative.   Musculoskeletal:       See history of present illness  Skin: Negative.   Neurological: Negative for focal weakness, loss of consciousness and headaches.  Endo/Heme/Allergies: Negative.   Psychiatric/Behavioral: Negative.    Blood pressure 98/60, pulse (!) 58, temperature 98.5 F (36.9 C), temperature  source Oral, resp. rate 11, last menstrual period 08/26/2015, SpO2 99 %. Physical Exam  Constitutional: She is oriented to person, place, and time. She appears well-developed and well-nourished. No distress.  HENT:  Head: Normocephalic.  Right Ear: External ear normal.  Left Ear: External ear normal.  Nose: Nose normal.  Mouth/Throat: Oropharynx is clear and moist.  Eyes: Conjunctivae and EOM are normal. Pupils are equal, round, and reactive to light.  Neck: No tracheal deviation present.  No posterior midline tenderness  Cardiovascular: Normal rate, regular rhythm, normal heart sounds and intact distal pulses.   Respiratory: Effort normal and breath sounds normal. No stridor. No respiratory distress. She has no wheezes. She has no rales. She exhibits no tenderness.  GI: Soft. She exhibits no distension. There is tenderness. There is no rebound and no guarding.    Tenderness in the area of small abdominal contusion which is evolving. No generalized tenderness. No guarding. No peritonitis. Bowel sounds are normal.  Musculoskeletal: Normal range of motion. She exhibits no deformity.  Neurological: She is alert and oriented to person, place, and time. She exhibits normal muscle tone.  Skin: Skin is warm.  Multiple tattoos including right upper extremity and left lower extremity  Psychiatric: She has a normal mood and affect.    Assessment/Plan: Blunt abdominal trauma 7/29 with abdominal wall contusion. Recommend nonsteroidal anti-inflammatorys and muscle relaxers. The soreness is going to gradually improve over the next 3-4 weeks. No need for admission nor surgical intervention at this time. I spoke with Dr. Venora Maples.  Ski Polich E 09/07/2015, 7:34 AM

## 2015-09-07 NOTE — ED Notes (Signed)
Pt is in stable condition upon d/c and ambulates from ED. 

## 2016-05-28 ENCOUNTER — Emergency Department (HOSPITAL_COMMUNITY): Payer: 59

## 2016-05-28 ENCOUNTER — Encounter (HOSPITAL_COMMUNITY): Payer: Self-pay | Admitting: Emergency Medicine

## 2016-05-28 ENCOUNTER — Emergency Department (HOSPITAL_COMMUNITY)
Admission: EM | Admit: 2016-05-28 | Discharge: 2016-05-28 | Disposition: A | Discharge status: Home / Self Care | Payer: 59 | Attending: Physician Assistant | Admitting: Physician Assistant

## 2016-05-28 DIAGNOSIS — F17200 Nicotine dependence, unspecified, uncomplicated: Secondary | ICD-10-CM | POA: Insufficient documentation

## 2016-05-28 DIAGNOSIS — R059 Cough, unspecified: Secondary | ICD-10-CM

## 2016-05-28 DIAGNOSIS — R05 Cough: Secondary | ICD-10-CM

## 2016-05-28 DIAGNOSIS — J069 Acute upper respiratory infection, unspecified: Secondary | ICD-10-CM | POA: Insufficient documentation

## 2016-05-28 DIAGNOSIS — J0100 Acute maxillary sinusitis, unspecified: Secondary | ICD-10-CM | POA: Insufficient documentation

## 2016-05-28 DIAGNOSIS — Z79899 Other long term (current) drug therapy: Secondary | ICD-10-CM | POA: Insufficient documentation

## 2016-05-28 LAB — RAPID STREP SCREEN (MED CTR MEBANE ONLY): STREPTOCOCCUS, GROUP A SCREEN (DIRECT): NEGATIVE

## 2016-05-28 MED ORDER — ACETAMINOPHEN 500 MG PO TABS
1000.0000 mg | ORAL_TABLET | Freq: Once | ORAL | Status: AC
  Administered 2016-05-28: 1000 mg via ORAL
  Filled 2016-05-28: qty 2

## 2016-05-28 MED ORDER — DOXYCYCLINE HYCLATE 100 MG PO CAPS: 100.0000 mg | ORAL_CAPSULE | Freq: Two times a day (BID) | ORAL | 0 refills | Status: DC

## 2016-05-28 MED ORDER — LIDOCAINE VISCOUS 2 % MT SOLN
15.0000 mL | Freq: Once | OROMUCOSAL | Status: AC
  Administered 2016-05-28: 15 mL via OROMUCOSAL
  Filled 2016-05-28: qty 15

## 2016-05-28 MED ORDER — DEXAMETHASONE SODIUM PHOSPHATE 10 MG/ML IJ SOLN
10.0000 mg | Freq: Once | INTRAMUSCULAR | Status: AC
  Administered 2016-05-28: 10 mg via INTRAVENOUS
  Filled 2016-05-28: qty 1

## 2016-05-28 NOTE — ED Triage Notes (Signed)
Patient c/o sore throat for past 3 days and cough for over a week. Pt also having right ear ache. Pt has been taking tylenol.

## 2016-05-28 NOTE — ED Provider Notes (Signed)
[facility]-EMERGENCY DEPT Provider Note   CSN: [id number] Arrival date & time: 05/28/16  2024   By signing my name below, I, [name], attest that this documentation has been prepared under the direction and in the presence of [facility], PA-C. Electronically Signed: [name], Scribe. 05/28/16. 9:44 PM.   History   Chief Complaint Chief Complaint  Patient presents with  . Sore Throat   The history is provided by the patient and medical records. No language interpreter was used.    Maria Bailey is a 46 y.o. female who presents to the Emergency Department with concern for gradually worsening sinus pressure and sore throat x 3 days. She also notes right ear pain; congestion; waxing and waning subjective fevers and chills; fatigue, and productive cough. No relief with robitussin cold & flu, benadryl or tylenol cold & flu. No other modifying factors noted.  Past Medical History:  Diagnosis Date  . Back pain   . Dysesthesia   . Face pain   . History of stomach ulcers   . Neck pain   . Right arm pain   . Right leg pain     Patient Active Problem List   Diagnosis Date Noted  . Right leg pain   . Back pain   . Dysesthesia     Past Surgical History:  Procedure Laterality Date  . c sections     x2  . COLONOSCOPY      OB History    No data available       Home Medications    Prior to Admission medications   Medication Sig Start Date End Date Taking? Authorizing Provider  acetaminophen (TYLENOL) 500 MG tablet Take 1,500 mg by mouth daily as needed (pain).    Historical Provider, MD  buPROPion (WELLBUTRIN XL) 150 MG 24 hr tablet Take 150 mg by mouth daily.    Historical Provider, MD  cyclobenzaprine (FLEXERIL) 10 MG tablet Take 1 tablet (10 mg total) by mouth 2 (two) times daily as needed for muscle spasms. 09/07/15   Roxy Horseman, PA-C  ondansetron (ZOFRAN) 4 MG tablet Take 1 tablet (4 mg total) by mouth every 6 (six) hours. 09/07/15   Roxy Horseman, PA-C    pantoprazole (PROTONIX) 40 MG tablet Take 40 mg by mouth 2 (two) times daily.    Historical Provider, MD    Family History Family History  Problem Relation Age of Onset  . Diabetes Father   . Cancer Maternal Grandmother   . Cancer Maternal Grandfather   . Fibromyalgia Sister     Social History Social History  Substance Use Topics  . Smoking status: Current Every Day Smoker  . Smokeless tobacco: Never Used  . Alcohol use 1.2 oz/week    2 Glasses of wine per week     Comment: rare     Allergies   Amoxicillin and Penicillins   Review of Systems Review of Systems  Constitutional: Positive for appetite change, chills, fatigue and fever.  HENT: Positive for congestion, ear pain, sinus pain (R side), sinus pressure (R side) and sore throat.   Respiratory: Positive for cough.   Gastrointestinal: Negative for abdominal pain, diarrhea, nausea and vomiting.  All other systems reviewed and are negative.    Physical Exam Updated Vital Signs BP 118/74 (BP Location: Left Arm)   Pulse (!) 107   Temp 99.2 F (37.3 C) (Oral)   Resp 18   LMP 05/28/2016   SpO2 96%   Physical Exam  Constitutional: She  is oriented to person, place, and time. She appears well-developed and well-nourished.  HENT:  Head: Normocephalic and atraumatic.  Right Ear: Tympanic membrane normal.  Left Ear: Tympanic membrane normal.  Mouth/Throat: Posterior oropharyngeal erythema present.  Oropharynx with erythema but no exudates or tonsillar hypertrophy; Focal area of tenderness to R maxillary sinus  Cardiovascular: Normal rate, regular rhythm and normal heart sounds.   No murmur heard. Pulmonary/Chest: Effort normal and breath sounds normal. No respiratory distress. She has no wheezes.  Lungs CTA bilaterally  Abdominal: Soft. She exhibits no distension. There is no tenderness.  Musculoskeletal: She exhibits no edema.  Lymphadenopathy:    She has cervical adenopathy.  Neurological: She is alert and  oriented to person, place, and time.  Skin: Skin is warm and dry.  Nursing note and vitals reviewed.    ED Treatments / Results  DIAGNOSTIC STUDIES: Oxygen Saturation is 96% on RA, adequate by my interpretation.    COORDINATION OF CARE: 9:41 PM-Discussed next steps with pt. Pt verbalized understanding and is agreeable with the plan. Will order medications and imaging, then Rx medications.   Labs (all labs ordered are listed, but only abnormal results are displayed) Labs Reviewed  RAPID STREP SCREEN (NOT AT [facility])  CULTURE, GROUP A STREP [facility])    EKG  EKG Interpretation None       Radiology Dg Chest 2 View  Result Date: 05/28/2016 CLINICAL DATA:  46 year old female with cough. EXAM: CHEST  2 VIEW COMPARISON:  None. FINDINGS: The heart size and mediastinal contours are within normal limits. Both lungs are clear. The visualized skeletal structures are unremarkable. IMPRESSION: No active cardiopulmonary disease. Electronically Signed   By: Elgie Collard M.D.   On: 05/28/2016 22:31    Procedures Procedures (including critical care time)  Medications Ordered in ED Medications  acetaminophen (TYLENOL) tablet 1,000 mg (not administered)  lidocaine (XYLOCAINE) 2 % viscous mouth solution 15 mL (15 mLs Mouth/Throat Given 05/28/16 2252)  dexamethasone (DECADRON) injection 10 mg (10 mg Intravenous Given 05/28/16 2203)     Initial Impression / Assessment and Plan / ED Course  I have reviewed the triage vital signs and the nursing notes.  Pertinent labs & imaging results that were available during my care of the patient were reviewed by me and considered in my medical decision making (see chart for details).    Maria Bailey is a 46 y.o. female who presents to ED for sinus pressure, sore throat, productive cough, subjective fever and right ear pain x 3 days. On exam, patient with erythema but no exudates or tonsillar hypertrophy - rapid strep negative. Lungs are clear and CXR  negative. She does have focal area of sinus tenderness to right maxillary sinus. Will treat with doxycyline as she states she has had an anaphylactic reaction to PCN and also does not tolerate Augmentin. PCP follow up encouraged. Return precautions discussed and all questions answered.   Final Clinical Impressions(s) / ED Diagnoses   Final diagnoses:  Cough    New Prescriptions New Prescriptions   No medications on file   I personally performed the services described in this documentation, which was scribed in my presence. The recorded information has been reviewed and is accurate.    [facility] Brenton Joines, PA-C 05/28/16 2301    Courteney Randall An, MD 06/03/16 1356

## 2016-05-28 NOTE — Discharge Instructions (Signed)
It was my pleasure taking care of you today!  Please take all of your antibiotics until finished!  Alternate between Tylenol and ibuprofen as needed for fevers/pain. Gargle warm salt water and spit it out. It is very important to stay hydrated!  Follow up with your primary care doctor in 5-7 days for recheck of ongoing symptoms and return to emergency department if any new or worsening of symptoms develop or you have any additional concerns.

## 2016-05-31 LAB — CULTURE, GROUP A STREP (THRC)

## 2016-07-03 ENCOUNTER — Encounter: Payer: Self-pay | Admitting: Emergency Medicine

## 2016-07-03 ENCOUNTER — Ambulatory Visit (INDEPENDENT_AMBULATORY_CARE_PROVIDER_SITE_OTHER): Payer: Self-pay | Admitting: Emergency Medicine

## 2016-07-03 VITALS — BP 135/75 | HR 90 | Temp 98.6°F | Resp 18 | Ht 62.0 in | Wt 197.8 lb

## 2016-07-03 DIAGNOSIS — Z0289 Encounter for other administrative examinations: Secondary | ICD-10-CM

## 2016-07-03 NOTE — Progress Notes (Signed)
Commercial Driver Medical Examination   Maria Bailey is a 46 y.o. female who presents today for a commercial driver fitness determination physical exam. The patient reports no problems. The following portions of the patient's history were reviewed and updated as appropriate: allergies, current medications, past family history, past medical history, past social history, past surgical history and problem list. Review of Systems: Unremarkable   Objective:    Applicant can recognize and distinguish among traffic control signals and devices showing standard red, green, and amber colors.     Visual Acuity Screening   Right eye Left eye Both eyes  Without correction:     With correction: 20/20 20/20 20/20   Comments: The patient can distinguish the colors red, amber and green.  Peripheral Vision: Right eye 85 degrees. Left eye 85 degrees.    Hearing Screening Comments: The patient was able to hear a forced whisper from 10 feet.    Monocular Vision?: No       BP 135/75   Pulse 90   Temp 98.6 F (37 C) (Oral)   Resp 18   Ht 5\' 2"  (1.575 m)   Wt 197 lb 12.8 oz (89.7 kg)   SpO2 96%   BMI 36.18 kg/m   General Appearance:    Alert, cooperative, no distress, appears stated age  Head:    Normocephalic, without obvious abnormality, atraumatic  Eyes:    PERRL, conjunctiva/corneas clear, EOM's intact, fundi    benign, both eyes  Ears:    Normal TM's and external ear canals, both ears  Nose:   Nares normal, septum midline, mucosa normal, no drainage    or sinus tenderness  Throat:   Lips, mucosa, and tongue normal; teeth and gums normal  Neck:   Supple, symmetrical, trachea midline, no adenopathy;    thyroid:  no enlargement/tenderness/nodules; no carotid   bruit or JVD  Back:     Symmetric, no curvature, ROM normal, no CVA tenderness  Lungs:     Clear to auscultation bilaterally, respirations unlabored  Chest Wall:    No tenderness or deformity   Heart:    Regular rate and  rhythm, S1 and S2 normal, no murmur, rub   or gallop     Abdomen:     Soft, non-tender, bowel sounds active all four quadrants,    no masses, no organomegaly        Extremities:   Extremities normal, atraumatic, no cyanosis or edema  Pulses:   2+ and symmetric all extremities  Skin:   Skin color, texture, turgor normal, no rashes or lesions  Lymph nodes:   Cervical, supraclavicular, and axillary nodes normal  Neurologic:   CNII-XII intact, normal strength, sensation and reflexes    throughout    Labs: Lab Results  Component Value Date   PROTEINUR NEGATIVE 09/07/2015   BILIRUBINUR NEGATIVE 09/07/2015   GLUCOSEU NEGATIVE 09/07/2015      Assessment:    Healthy female exam.  Meets standards in 7949 CFR 391.41;  qualifies for 2 year certificate.    Plan:    Medical examiners certificate completed and printed. Return as needed.

## 2016-07-03 NOTE — Patient Instructions (Signed)
     IF you received an x-ray today, you will receive an invoice from [facility]. Please contact [facility] at [phone] with questions or concerns regarding your invoice.   IF you received labwork today, you will receive an invoice from [facility]. Please contact [facility] at [phone] with questions or concerns regarding your invoice.   Our billing staff will not be able to assist you with questions regarding bills from these companies.  You will be contacted with the lab results as soon as they are available. The fastest way to get your results is to activate your My Chart account. Instructions are located on the last page of this paperwork. If you have not heard from us regarding the results in 2 weeks, please contact this office.     

## 2017-04-17 ENCOUNTER — Encounter (HOSPITAL_COMMUNITY): Payer: Self-pay | Admitting: Emergency Medicine

## 2017-04-17 ENCOUNTER — Ambulatory Visit (HOSPITAL_COMMUNITY)
Admission: EM | Admit: 2017-04-17 | Discharge: 2017-04-17 | Disposition: A | Discharge status: Home / Self Care | Payer: Self-pay | Attending: Family Medicine | Admitting: Family Medicine

## 2017-04-17 ENCOUNTER — Other Ambulatory Visit: Payer: Self-pay

## 2017-04-17 ENCOUNTER — Ambulatory Visit (INDEPENDENT_AMBULATORY_CARE_PROVIDER_SITE_OTHER): Payer: Self-pay

## 2017-04-17 DIAGNOSIS — M7918 Myalgia, other site: Secondary | ICD-10-CM

## 2017-04-17 DIAGNOSIS — S161XXA Strain of muscle, fascia and tendon at neck level, initial encounter: Secondary | ICD-10-CM

## 2017-04-17 DIAGNOSIS — M542 Cervicalgia: Secondary | ICD-10-CM

## 2017-04-17 DIAGNOSIS — R0789 Other chest pain: Secondary | ICD-10-CM

## 2017-04-17 DIAGNOSIS — M545 Low back pain, unspecified: Secondary | ICD-10-CM

## 2017-04-17 MED ORDER — CYCLOBENZAPRINE HCL 10 MG PO TABS: 10.0000 mg | ORAL_TABLET | Freq: Two times a day (BID) | ORAL | 0 refills | Status: DC | PRN

## 2017-04-17 MED ORDER — HYDROCODONE-ACETAMINOPHEN 5-325 MG PO TABS: 1.0000 | ORAL_TABLET | Freq: Four times a day (QID) | ORAL | 0 refills | Status: DC | PRN

## 2017-04-17 NOTE — Discharge Instructions (Signed)
Follow up with your primary care provider.
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
## 2018-07-18 ENCOUNTER — Emergency Department (HOSPITAL_COMMUNITY): Payer: Self-pay

## 2018-07-18 ENCOUNTER — Encounter (HOSPITAL_COMMUNITY): Payer: Self-pay | Admitting: Emergency Medicine

## 2018-07-18 ENCOUNTER — Emergency Department (HOSPITAL_COMMUNITY)
Admission: EM | Admit: 2018-07-18 | Discharge: 2018-07-19 | Disposition: A | Discharge status: Home / Self Care | Payer: Self-pay | Attending: Emergency Medicine | Admitting: Emergency Medicine

## 2018-07-18 ENCOUNTER — Other Ambulatory Visit: Payer: Self-pay

## 2018-07-18 DIAGNOSIS — Y999 Unspecified external cause status: Secondary | ICD-10-CM | POA: Insufficient documentation

## 2018-07-18 DIAGNOSIS — S098XXA Other specified injuries of head, initial encounter: Secondary | ICD-10-CM | POA: Insufficient documentation

## 2018-07-18 DIAGNOSIS — S060X1A Concussion with loss of consciousness of 30 minutes or less, initial encounter: Secondary | ICD-10-CM

## 2018-07-18 DIAGNOSIS — F17200 Nicotine dependence, unspecified, uncomplicated: Secondary | ICD-10-CM | POA: Insufficient documentation

## 2018-07-18 DIAGNOSIS — S0990XA Unspecified injury of head, initial encounter: Secondary | ICD-10-CM

## 2018-07-18 DIAGNOSIS — W228XXA Striking against or struck by other objects, initial encounter: Secondary | ICD-10-CM | POA: Insufficient documentation

## 2018-07-18 DIAGNOSIS — Y929 Unspecified place or not applicable: Secondary | ICD-10-CM | POA: Insufficient documentation

## 2018-07-18 DIAGNOSIS — Z79899 Other long term (current) drug therapy: Secondary | ICD-10-CM | POA: Insufficient documentation

## 2018-07-18 DIAGNOSIS — Y9389 Activity, other specified: Secondary | ICD-10-CM | POA: Insufficient documentation

## 2018-07-18 DIAGNOSIS — S0101XA Laceration without foreign body of scalp, initial encounter: Secondary | ICD-10-CM

## 2018-07-18 LAB — BASIC METABOLIC PANEL
Anion gap: 9 (ref 5–15)
BUN: 15 mg/dL (ref 6–20)
CO2: 23 mmol/L (ref 22–32)
Calcium: 9.1 mg/dL (ref 8.9–10.3)
Chloride: 108 mmol/L (ref 98–111)
Creatinine, Ser: 1.01 mg/dL — ABNORMAL HIGH (ref 0.44–1.00)
GFR calc Af Amer: >60 mL/min (ref >60)
GFR calc non Af Amer: >60 mL/min (ref >60)
Glucose, Bld: 83 mg/dL (ref 70–99)
Potassium: 3.9 mmol/L (ref 3.5–5.1)
Sodium: 140 mmol/L (ref 135–145)

## 2018-07-18 LAB — CBC WITH DIFFERENTIAL/PLATELET
Abs Immature Granulocytes: 0.07 10*3/uL (ref 0.00–0.07)
Basophils Absolute: 0 10*3/uL (ref 0.0–0.1)
Basophils Relative: 0 %
Eosinophils Absolute: 0.3 10*3/uL (ref 0.0–0.5)
Eosinophils Relative: 2 %
HCT: 39 % (ref 36.0–46.0)
Hemoglobin: 12.3 g/dL (ref 12.0–15.0)
Immature Granulocytes: 1 %
Lymphocytes Relative: 21 %
Lymphs Abs: 2.6 10*3/uL (ref 0.7–4.0)
MCH: 28 pg (ref 26.0–34.0)
MCHC: 31.5 g/dL (ref 30.0–36.0)
MCV: 88.6 fL (ref 80.0–100.0)
Monocytes Absolute: 0.5 10*3/uL (ref 0.1–1.0)
Monocytes Relative: 4 %
Neutro Abs: 8.8 10*3/uL — ABNORMAL HIGH (ref 1.7–7.7)
Neutrophils Relative %: 72 %
Platelets: 240 10*3/uL (ref 150–400)
RBC: 4.4 MIL/uL (ref 3.87–5.11)
RDW: 14.2 % (ref 11.5–15.5)
WBC: 12.3 10*3/uL — ABNORMAL HIGH (ref 4.0–10.5)
nRBC: 0 % (ref 0.0–0.2)

## 2018-07-18 LAB — RAPID URINE DRUG SCREEN, HOSP PERFORMED
Amphetamines: NOT DETECTED
Barbiturates: NOT DETECTED
Benzodiazepines: NOT DETECTED
Cocaine: NOT DETECTED
Opiates: NOT DETECTED
Tetrahydrocannabinol: NOT DETECTED

## 2018-07-18 LAB — PROTIME-INR
INR: 1 (ref 0.8–1.2)
Prothrombin Time: 13.2 seconds (ref 11.4–15.2)

## 2018-07-18 LAB — I-STAT BETA HCG BLOOD, ED (MC, WL, AP ONLY): I-stat hCG, quantitative: <5 m[IU]/mL (ref <5)

## 2018-07-18 LAB — ETHANOL: Alcohol, Ethyl (B): <10 mg/dL (ref <10)

## 2018-07-18 MED ORDER — ACETAMINOPHEN 325 MG PO TABS
650.0000 mg | ORAL_TABLET | Freq: Once | ORAL | Status: AC
  Administered 2018-07-18: 650 mg via ORAL
  Filled 2018-07-18: qty 2

## 2018-07-18 MED ORDER — LIDOCAINE-EPINEPHRINE (PF) 2 %-1:200000 IJ SOLN
10.0000 mL | Freq: Once | INTRAMUSCULAR | Status: DC
  Filled 2018-07-18: qty 20

## 2018-07-18 MED ORDER — ONDANSETRON 4 MG PO TBDP
4.0000 mg | ORAL_TABLET | Freq: Once | ORAL | Status: AC
  Administered 2018-07-18: 23:00:00 4 mg via ORAL
  Filled 2018-07-18: qty 1
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
## 2018-07-18 NOTE — ED Notes (Signed)
Patient transported to CT 

## 2018-07-18 NOTE — ED Notes (Signed)
Neoma Laming @ [phone] is pt aunt; update provided and will pick up pt upon discharge.

## 2018-07-18 NOTE — ED Notes (Signed)
Family contact #: [phone]

## 2018-07-18 NOTE — ED Notes (Signed)
Noted pt is ambulating in the room with no issue. Attempted to obtain urine sample; pt stated unable to do so at this time.
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
## 2018-07-19 NOTE — Discharge Instructions (Addendum)
You were examined today for a head injury and concussion.  You had staples placed for your laceration, these will need to be removed in 7 to 10 days.  Your CT showed no evidence of head fracture or bleeding in the brain.  I am reassured that your confusion and speech changes have improved. Please avoid stimuli and allow your concussion symptoms to continue to improve.  Sometimes serious problems can develop after a head injury. Please return to the emergency department if you experience any of the following symptoms: Repeated vomiting Headache that gets worse and does not go away Loss of consciousness or inability to stay awake at times when you normally would be able to Getting more confused, restless or agitated Convulsions or seizures Difficulty walking or feeling off balance Weakness or numbness Vision changes A concussion is a very mild traumatic brain injury caused by a bump, jolt or blow to the head, most people recover quickly and fully. You can experience a wide variety of symptoms including:   - Confusion      - Difficulty concentrating       - Trouble remembering new info  - Headache      - Dizziness        - Fuzzy or blurry vision  - Fatigue      - Balance problems      - Light sensitivity  - Mood swings     - Changes in sleep or difficulty sleeping   To help these symptoms improve make sure you are getting plenty of rest, avoid screen time, loud music and strenuous mental activities. Avoid any strenuous physical activities, once your symptoms have resolved a slow and gradual return to activity is recommended. It is very important that you avoid situations in which you might sustain a second head injury as this can be very dangerous and life threatening. You cannot be medically cleared to return to normal activities until you have followed up with your primary doctor or a concussion specialist for reevaluation.

## 2018-07-20 ENCOUNTER — Telehealth: Payer: Self-pay
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
## 2018-07-23 ENCOUNTER — Other Ambulatory Visit: Payer: Self-pay

## 2018-07-23 ENCOUNTER — Encounter: Payer: Self-pay | Admitting: Family Medicine

## 2018-07-23 ENCOUNTER — Ambulatory Visit (INDEPENDENT_AMBULATORY_CARE_PROVIDER_SITE_OTHER): Payer: No Typology Code available for payment source | Admitting: Family Medicine

## 2018-07-23 DIAGNOSIS — S060X9A Concussion with loss of consciousness of unspecified duration, initial encounter: Secondary | ICD-10-CM | POA: Insufficient documentation

## 2018-07-23 DIAGNOSIS — S060X0A Concussion without loss of consciousness, initial encounter: Secondary | ICD-10-CM | POA: Diagnosis not present

## 2018-07-23 DIAGNOSIS — S060XAA Concussion with loss of consciousness status unknown, initial encounter: Secondary | ICD-10-CM | POA: Insufficient documentation

## 2018-07-23 NOTE — Assessment & Plan Note (Signed)
Patient does have a concussion.  Moderate severity.  We discussed vitamin supplementation.  Patient has significant vestibular neuro difficulties and I do believe that this is an ocular subset.  Patient is going to be sent to formal physical therapy, start vestibular neuro training at home as well patient will be held out of work for a week and then follow-up again in 1 week.  Hopefully at that point we can get her back to part-time.  Action plan given if worsening symptoms.

## 2018-07-23 NOTE — Patient Instructions (Signed)
Good to see you 200mg  CoQ10 2 grams of Fish oil 2000 IU of Vitamin D See me again in 1 week

## 2018-07-30 ENCOUNTER — Ambulatory Visit: Payer: No Typology Code available for payment source | Admitting: Family Medicine

## 2018-07-31 NOTE — Progress Notes (Signed)
Subjective:   I, Maria Bailey, am serving as a scribe for Dr. Hulan Saas.   Chief Complaint: Maria Bailey, DOB: December 28, 1970, is a 48 y.o. female who presents for follow up for head injury. Patient states that she feels like she is back to normal. She drove from [place] yesterday without any issue. Has been doing her exercises which feels helped.  No chief complaint on file.   Injury date : 07/18/2018 Visit #: 2  Previous imagine.   History of Present Illness:    Concussion Self-Reported Symptom Score Symptoms rated on a scale 1-6, in last 24 hours All symptoms are at zero today   Total Symptom Score: 0 Previous Symptom Score: 36  Review of Systems:  No , visual changes, nausea, vomiting, diarrhea, constipation, dizziness, abdominal pain, skin rash, fevers, chills, night sweats, weight loss, swollen lymph nodes, body aches, joint swelling, muscle aches, chest pain, shortness of breath, mood changes.   Headaches have resolved as well.   Review of History: Past Medical History:  Past Medical History:  Diagnosis Date  . Back pain   . Dysesthesia   . Face pain   . History of stomach ulcers   . Neck pain   . Right arm pain   . Right leg pain     Past Surgical History:  has a past surgical history that includes Colonoscopy and c sections. Family History: family history includes Cancer in her maternal grandfather and maternal grandmother; Diabetes in her father; Fibromyalgia in her sister. no family history of autoimmune Social History:  reports that she has been smoking. She has never used smokeless tobacco. She reports current alcohol use of about 2.0 standard drinks of alcohol per week. She reports that she does not use drugs. Current Medications: has a current medication list which includes the following prescription(s): multivitamin with minerals and pantoprazole. Allergies: is allergic to amoxicillin; penicillins; and other.  Objective:    Physical  Examination Vitals:   08/02/18 0826  BP: 112/80  Pulse: 88  SpO2: 97%   General: No apparent distress alert and oriented x3 mood and affect normal, dressed appropriately.  HEENT: Pupils equal, extraocular movements intact  Respiratory: Patient's speak in full sentences and does not appear short of breath  Cardiovascular: No lower extremity edema, non tender, no erythema no nystagmus  Skin: Warm dry intact with no signs of infection or rash on extremities or on axial skeleton.  Abdomen: Soft nontender  Neuro: Cranial nerves II through XII are intact, neurovascularly intact in all extremities with 2+ DTRs and 2+ pulses.  Lymph: No lymphadenopathy of posterior or anterior cervical chain or axillae bilaterally.  Gait normal with good balance and coordination.  MSK:  Non tender with full range of motion and good stability and symmetric strength and tone of shoulders, elbows, wrist,  knee and ankles bilaterally.  Psychiatric: Oriented X3, intact recent and remote memory, judgement and insight, normal mood and affect  Concussion testing performed today:  I spent 21 minutes with patient discussing test and results including review of history and patient chart and  integration of patient data, interpretation of standardized test results and clinical data, clinical decision making, treatment planning and report,and interactive feedback to the patient with all of patients questions answered.     After Visit Summary printed out and provided to patient as appropriate.

## 2018-08-02 ENCOUNTER — Other Ambulatory Visit: Payer: Self-pay

## 2018-08-02 ENCOUNTER — Ambulatory Visit (INDEPENDENT_AMBULATORY_CARE_PROVIDER_SITE_OTHER): Payer: No Typology Code available for payment source | Admitting: Family Medicine

## 2018-08-02 ENCOUNTER — Encounter: Payer: Self-pay | Admitting: Family Medicine

## 2018-08-02 DIAGNOSIS — S060X0D Concussion without loss of consciousness, subsequent encounter: Secondary | ICD-10-CM | POA: Diagnosis not present

## 2018-08-02 NOTE — Patient Instructions (Signed)
Good to see you  Removed the staples  Stay active Get back at it tomorrow  See [place] when you need [place]!

## 2018-08-02 NOTE — Assessment & Plan Note (Signed)
Completely resolved at this time.  Patient is to restart work tomorrow.  With no restrictions.  If any worsening symptoms patient is to call otherwise can follow-up as needed

## 2020-04-01 ENCOUNTER — Other Ambulatory Visit: Payer: Self-pay

## 2020-04-01 ENCOUNTER — Emergency Department (HOSPITAL_BASED_OUTPATIENT_CLINIC_OR_DEPARTMENT_OTHER)
Admission: EM | Admit: 2020-04-01 | Discharge: 2020-04-01 | Disposition: A | Discharge status: Home / Self Care | Payer: No Typology Code available for payment source | Attending: Emergency Medicine | Admitting: Emergency Medicine

## 2020-04-01 ENCOUNTER — Encounter (HOSPITAL_BASED_OUTPATIENT_CLINIC_OR_DEPARTMENT_OTHER): Payer: Self-pay | Admitting: *Deleted

## 2020-04-01 DIAGNOSIS — F17210 Nicotine dependence, cigarettes, uncomplicated: Secondary | ICD-10-CM | POA: Insufficient documentation

## 2020-04-01 DIAGNOSIS — R5383 Other fatigue: Secondary | ICD-10-CM | POA: Diagnosis not present

## 2020-04-01 DIAGNOSIS — J069 Acute upper respiratory infection, unspecified: Secondary | ICD-10-CM

## 2020-04-01 DIAGNOSIS — Z20822 Contact with and (suspected) exposure to covid-19: Secondary | ICD-10-CM | POA: Insufficient documentation

## 2020-04-01 DIAGNOSIS — J029 Acute pharyngitis, unspecified: Secondary | ICD-10-CM | POA: Insufficient documentation

## 2020-04-01 LAB — GROUP A STREP BY PCR: Group A Strep by PCR: NOT DETECTED

## 2020-04-01 NOTE — ED Provider Notes (Signed)
[facility] EMERGENCY DEPARTMENT Provider Note   CSN: [id number] Arrival date & time: 04/01/20  8546     History Chief Complaint  Patient presents with  . Sore Throat    Maria Bailey is a 50 y.o. female with c/o sore throat. Onset of sxs x1 day. Associated chills, cough, myalgias and fatigue. Denies N/V/D.Vaccinated x2 against Covid. No booster. No contacts with similar sxs.  HPI     Past Medical History:  Diagnosis Date  . Back pain   . Dysesthesia   . Face pain   . History of stomach ulcers   . Neck pain   . Right arm pain   . Right leg pain     Patient Active Problem List   Diagnosis Date Noted  . Concussion 07/23/2018  . Right leg pain   . Back pain   . Dysesthesia     Past Surgical History:  Procedure Laterality Date  . c sections     x2  . COLONOSCOPY       OB History   No obstetric history on file.     Family History  Problem Relation Age of Onset  . Diabetes Father   . Cancer Maternal Grandmother   . Cancer Maternal Grandfather   . Fibromyalgia Sister     Social History   Tobacco Use  . Smoking status: Current Every Day Smoker    Packs/day: 1.00    Types: Cigarettes  . Smokeless tobacco: Never Used  Substance Use Topics  . Alcohol use: Yes    Alcohol/week: 2.0 standard drinks    Types: 2 Glasses of wine per week    Comment: rare  . Drug use: No    Home Medications Prior to Admission medications   Medication Sig Start Date End Date Taking? Authorizing Provider  Multiple Vitamin (MULTIVITAMIN WITH MINERALS) TABS tablet Take 1 tablet by mouth daily.    [provider]  pantoprazole (PROTONIX) 40 MG tablet Take 40 mg by mouth 2 (two) times daily.    [provider]    Allergies    Amoxicillin, Penicillins, and Other  Review of Systems   Review of Systems Ten systems reviewed and are negative for acute change, except as noted in the HPI.   Physical Exam Updated Vital Signs BP (!) 129/56 (BP  Location: Left Arm)   Pulse 90   Temp 98.4 F (36.9 C) (Oral)   Resp 18   Ht 5\' 2"  (1.575 m)   Wt 79.4 kg   SpO2 96%   BMI 32.01 kg/m   Physical Exam Vitals and nursing note reviewed.  Constitutional:      General: She is not in acute distress.    Appearance: She is well-developed and well-nourished. She is not diaphoretic.  HENT:     Head: Normocephalic and atraumatic.     Mouth/Throat:     Mouth: Mucous membranes are moist.     Pharynx: Uvula midline. Posterior oropharyngeal erythema present. No pharyngeal swelling, oropharyngeal exudate or uvula swelling.     Tonsils: No tonsillar exudate or tonsillar abscesses.  Eyes:     General: No scleral icterus.    Conjunctiva/sclera: Conjunctivae normal.  Cardiovascular:     Rate and Rhythm: Normal rate and regular rhythm.     Heart sounds: Normal heart sounds. No murmur heard. No friction rub. No gallop.   Pulmonary:     Effort: Pulmonary effort is normal. No respiratory distress.     Breath sounds: Normal breath  sounds.  Abdominal:     General: Bowel sounds are normal. There is no distension.     Palpations: Abdomen is soft. There is no mass.     Tenderness: There is no abdominal tenderness. There is no guarding.  Musculoskeletal:     Cervical back: Normal range of motion.  Lymphadenopathy:     Head:     Right side of head: No tonsillar adenopathy.     Left side of head: No tonsillar adenopathy.  Skin:    General: Skin is warm and dry.  Neurological:     Mental Status: She is alert and oriented to person, place, and time.  Psychiatric:        Behavior: Behavior normal.     ED Results / Procedures / Treatments   Labs (all labs ordered are listed, but only abnormal results are displayed) Labs Reviewed  GROUP A STREP BY PCR  SARS CORONAVIRUS 2 (TAT 6-24 HRS)    EKG None  Radiology No results found.  Procedures Procedures   Medications Ordered in ED Medications - No data to display  ED Course  I have  reviewed the triage vital signs and the nursing notes.  Pertinent labs & imaging results that were available during my care of the patient were reviewed by me and considered in my medical decision making (see chart for details).    MDM Rules/Calculators/A&P                          Patient with URI sxs.  Strep test is negative. COvid test pending. Home isolation instructed. Take otc meds for relief of sxs.,  Maria Bailey was evaluated in Emergency Department on 04/01/2020 for the symptoms described in the history of present illness. She was evaluated in the context of the global COVID-19 pandemic, which necessitated consideration that the patient might be at risk for infection with the SARS-CoV-2 virus that causes COVID-19. Institutional protocols and algorithms that pertain to the evaluation of patients at risk for COVID-19 are in a state of rapid change based on information released by regulatory bodies including the CDC and federal and state organizations. These policies and algorithms were followed during the patient's care in the ED.  Final Clinical Impression(s) / ED Diagnoses Final diagnoses:  None    Rx / DC Orders ED Discharge Orders    None       Delos Haring 04/01/20 2008    Jacalyn Lefevre, MD 04/01/20 2026

## 2020-04-01 NOTE — ED Notes (Signed)
EDP at bedside  

## 2020-04-01 NOTE — Discharge Instructions (Signed)
Your strep test was negative. Your covid test is pending.  You will need to stay in isolation from others until you have the results.  Is over-the-counter cold medicines for relief of her symptoms.

## 2020-04-01 NOTE — ED Triage Notes (Signed)
Cough, sore throat, subjective fever and fatigue x 2 days.

## 2020-04-02 LAB — SARS CORONAVIRUS 2 (TAT 6-24 HRS): SARS Coronavirus 2: NEGATIVE

## 2020-04-04 ENCOUNTER — Encounter (HOSPITAL_BASED_OUTPATIENT_CLINIC_OR_DEPARTMENT_OTHER): Payer: Self-pay | Admitting: Emergency Medicine

## 2020-04-04 ENCOUNTER — Emergency Department (HOSPITAL_BASED_OUTPATIENT_CLINIC_OR_DEPARTMENT_OTHER)
Admission: EM | Admit: 2020-04-04 | Discharge: 2020-04-04 | Disposition: A | Discharge status: Home / Self Care | Payer: PRIVATE HEALTH INSURANCE | Attending: Emergency Medicine | Admitting: Emergency Medicine

## 2020-04-04 ENCOUNTER — Other Ambulatory Visit: Payer: Self-pay

## 2020-04-04 DIAGNOSIS — H6692 Otitis media, unspecified, left ear: Secondary | ICD-10-CM | POA: Diagnosis not present

## 2020-04-04 DIAGNOSIS — F17210 Nicotine dependence, cigarettes, uncomplicated: Secondary | ICD-10-CM | POA: Diagnosis not present

## 2020-04-04 DIAGNOSIS — H9202 Otalgia, left ear: Secondary | ICD-10-CM | POA: Diagnosis present

## 2020-04-04 DIAGNOSIS — R0981 Nasal congestion: Secondary | ICD-10-CM | POA: Diagnosis not present

## 2020-04-04 DIAGNOSIS — R059 Cough, unspecified: Secondary | ICD-10-CM | POA: Diagnosis not present

## 2020-04-04 DIAGNOSIS — J029 Acute pharyngitis, unspecified: Secondary | ICD-10-CM | POA: Insufficient documentation

## 2020-04-04 DIAGNOSIS — H6690 Otitis media, unspecified, unspecified ear: Secondary | ICD-10-CM

## 2020-04-04 MED ORDER — KETOROLAC TROMETHAMINE 15 MG/ML IJ SOLN
15.0000 mg | Freq: Once | INTRAMUSCULAR | Status: AC
  Administered 2020-04-04: 15 mg via INTRAMUSCULAR
  Filled 2020-04-04: qty 1

## 2020-04-04 MED ORDER — CEFDINIR 300 MG PO CAPS: 300.0000 mg | ORAL_CAPSULE | Freq: Two times a day (BID) | ORAL | 0 refills | Status: DC

## 2020-04-04 NOTE — ED Notes (Signed)
ED Provider at bedside. 

## 2020-04-04 NOTE — ED Triage Notes (Signed)
C/o left ear pain x yesterday. Pt rinsed out with peroxide and felt better yesterday. Tonight woke pt up from sleep. Denies any drainage or hearing changes. Pt aaox3, ambulatory with steady gait, VSS, GCS 15, NAD noted.

## 2020-04-04 NOTE — ED Provider Notes (Signed)
[facility] EMERGENCY DEPARTMENT Provider Note   CSN: [id number] Arrival date & time: 04/04/20  0153     History Chief Complaint  Patient presents with  . Otalgia    left    Maria Bailey is a 50 y.o. female.  HPI     This is a 50 year old female who presents with left ear pain.  She was seen 2 days ago for sore throat.  Time she was negative for strep and Covid.  She states that she cleaned her ears out 2 days ago with hydrogen peroxide.  She has not had any pain but tonight she woke up in excruciating pain.  She is reports sharp pain in her left ear.  She rates her pain at 10 out of 10.  She did not take anything for her pain.  She is not had any fevers but has had ongoing sore throat and chills.  She reports a nonproductive cough.  Past Medical History:  Diagnosis Date  . Back pain   . Dysesthesia   . Face pain   . History of stomach ulcers   . Neck pain   . Right arm pain   . Right leg pain     Patient Active Problem List   Diagnosis Date Noted  . Concussion 07/23/2018  . Right leg pain   . Back pain   . Dysesthesia     Past Surgical History:  Procedure Laterality Date  . c sections     x2  . COLONOSCOPY       OB History   No obstetric history on file.     Family History  Problem Relation Age of Onset  . Diabetes Father   . Cancer Maternal Grandmother   . Cancer Maternal Grandfather   . Fibromyalgia Sister     Social History   Tobacco Use  . Smoking status: Current Every Day Smoker    Packs/day: 1.00    Types: Cigarettes  . Smokeless tobacco: Never Used  Substance Use Topics  . Alcohol use: Yes    Alcohol/week: 2.0 standard drinks    Types: 2 Glasses of wine per week    Comment: rare  . Drug use: No    Home Medications Prior to Admission medications   Medication Sig Start Date End Date Taking? Authorizing Provider  cefdinir (OMNICEF) 300 MG capsule Take 1 capsule (300 mg total) by mouth 2 (two) times daily. 04/04/20  Yes  Rhyan Wolters, Mayer Masker, MD  Multiple Vitamin (MULTIVITAMIN WITH MINERALS) TABS tablet Take 1 tablet by mouth daily.    [provider]  pantoprazole (PROTONIX) 40 MG tablet Take 40 mg by mouth 2 (two) times daily.    [provider]    Allergies    Amoxicillin, Penicillins, and Other  Review of Systems   Review of Systems  Constitutional: Negative for fever.  HENT: Positive for ear pain and sore throat. Negative for ear discharge.   Respiratory: Positive for cough.   Gastrointestinal: Negative for abdominal pain, nausea and vomiting.  All other systems reviewed and are negative.   Physical Exam Updated Vital Signs BP 120/60 (BP Location: Right Arm)   Pulse 88   Temp 97.8 F (36.6 C) (Oral)   Resp 19   Ht 1.575 m (5\' 2" )   Wt 79.4 kg   SpO2 97%   BMI 32.01 kg/m   Physical Exam Vitals and nursing note reviewed.  Constitutional:      Appearance: She is well-developed and well-nourished.  She is not ill-appearing.  HENT:     Head: Normocephalic and atraumatic.     Ears:     Comments: Erythema and bulging noted of the left TM with slight effusion    Nose: Congestion present.     Mouth/Throat:     Mouth: Mucous membranes are moist.  Eyes:     Pupils: Pupils are equal, round, and reactive to light.  Cardiovascular:     Rate and Rhythm: Normal rate and regular rhythm.  Pulmonary:     Effort: Pulmonary effort is normal. No respiratory distress.  Abdominal:     Palpations: Abdomen is soft.     Tenderness: There is no abdominal tenderness.  Musculoskeletal:     Cervical back: Neck supple.  Skin:    General: Skin is warm and dry.  Neurological:     Mental Status: She is alert and oriented to person, place, and time.  Psychiatric:        Mood and Affect: Mood and affect and mood normal.     ED Results / Procedures / Treatments   Labs (all labs ordered are listed, but only abnormal results are displayed) Labs Reviewed - No data to  display  EKG None  Radiology No results found.  Procedures Procedures   Medications Ordered in ED Medications  ketorolac (TORADOL) 15 MG/ML injection 15 mg (has no administration in time range)    ED Course  I have reviewed the triage vital signs and the nursing notes.  Pertinent labs & imaging results that were available during my care of the patient were reviewed by me and considered in my medical decision making (see chart for details).    MDM Rules/Calculators/A&P                          Patient presents with left ear pain.  Recent upper respiratory symptoms including sore throat and cough.  She is overall nontoxic and afebrile.  She does have evidence of erythema and bulging of the left TM which likely represents some otitis media.  May be viral mediated given additional symptoms.  Discussed Flonase and nasal saline.  Ibuprofen as needed for pain.  She was provided with a prescription for cefdinir if her symptoms do not improve in 1 to 2 days.  Patient stated understanding.  After history, exam, and medical workup I feel the patient has been appropriately medically screened and is safe for discharge home. Pertinent diagnoses were discussed with the patient. Patient was given return precautions.  Final Clinical Impression(s) / ED Diagnoses Final diagnoses:  Acute otitis media, unspecified otitis media type    Rx / DC Orders ED Discharge Orders         Ordered    cefdinir (OMNICEF) 300 MG capsule  2 times daily        04/04/20 0221           Gilberto Stanforth, Mayer Masker, MD 04/04/20 0225

## 2020-04-04 NOTE — Discharge Instructions (Addendum)
You were seen today and likely have an ear infection.  This could be viral in nature given your other symptoms.  For the next 2 days use Flonase to clear the inner ear.  Additionally, take 600 mg of ibuprofen for pain.  If you continue to have symptoms, you can get your prescription filled.

## 2020-09-11 DIAGNOSIS — G5601 Carpal tunnel syndrome, right upper limb: Secondary | ICD-10-CM | POA: Insufficient documentation

## 2020-10-15 ENCOUNTER — Emergency Department (HOSPITAL_BASED_OUTPATIENT_CLINIC_OR_DEPARTMENT_OTHER): Payer: No Typology Code available for payment source

## 2020-10-15 ENCOUNTER — Emergency Department (HOSPITAL_BASED_OUTPATIENT_CLINIC_OR_DEPARTMENT_OTHER)
Admission: EM | Admit: 2020-10-15 | Discharge: 2020-10-15 | Disposition: A | Discharge status: Home / Self Care | Payer: No Typology Code available for payment source | Attending: Emergency Medicine | Admitting: Emergency Medicine

## 2020-10-15 ENCOUNTER — Encounter (HOSPITAL_BASED_OUTPATIENT_CLINIC_OR_DEPARTMENT_OTHER): Payer: Self-pay

## 2020-10-15 ENCOUNTER — Other Ambulatory Visit: Payer: Self-pay

## 2020-10-15 DIAGNOSIS — M549 Dorsalgia, unspecified: Secondary | ICD-10-CM | POA: Insufficient documentation

## 2020-10-15 DIAGNOSIS — S161XXA Strain of muscle, fascia and tendon at neck level, initial encounter: Secondary | ICD-10-CM

## 2020-10-15 DIAGNOSIS — M542 Cervicalgia: Secondary | ICD-10-CM | POA: Diagnosis not present

## 2020-10-15 DIAGNOSIS — F17210 Nicotine dependence, cigarettes, uncomplicated: Secondary | ICD-10-CM | POA: Diagnosis not present

## 2020-10-15 DIAGNOSIS — Y900 Blood alcohol level of less than 20 mg/100 ml: Secondary | ICD-10-CM | POA: Insufficient documentation

## 2020-10-15 DIAGNOSIS — Z20822 Contact with and (suspected) exposure to covid-19: Secondary | ICD-10-CM | POA: Diagnosis not present

## 2020-10-15 DIAGNOSIS — W182XXA Fall in (into) shower or empty bathtub, initial encounter: Secondary | ICD-10-CM | POA: Insufficient documentation

## 2020-10-15 DIAGNOSIS — S0990XA Unspecified injury of head, initial encounter: Secondary | ICD-10-CM | POA: Diagnosis not present

## 2020-10-15 DIAGNOSIS — M79604 Pain in right leg: Secondary | ICD-10-CM | POA: Insufficient documentation

## 2020-10-15 DIAGNOSIS — M25572 Pain in left ankle and joints of left foot: Secondary | ICD-10-CM | POA: Insufficient documentation

## 2020-10-15 DIAGNOSIS — S199XXA Unspecified injury of neck, initial encounter: Secondary | ICD-10-CM | POA: Diagnosis present

## 2020-10-15 DIAGNOSIS — R52 Pain, unspecified: Secondary | ICD-10-CM

## 2020-10-15 DIAGNOSIS — W19XXXA Unspecified fall, initial encounter: Secondary | ICD-10-CM

## 2020-10-15 LAB — COMPREHENSIVE METABOLIC PANEL WITH GFR
ALT: 11 U/L (ref 0–44)
AST: 13 U/L — ABNORMAL LOW (ref 15–41)
Albumin: 3.8 g/dL (ref 3.5–5.0)
Alkaline Phosphatase: 57 U/L (ref 38–126)
Anion gap: 6 (ref 5–15)
BUN: 23 mg/dL — ABNORMAL HIGH (ref 6–20)
CO2: 26 mmol/L (ref 22–32)
Calcium: 8.9 mg/dL (ref 8.9–10.3)
Chloride: 107 mmol/L (ref 98–111)
Creatinine, Ser: 1.1 mg/dL — ABNORMAL HIGH (ref 0.44–1.00)
GFR, Estimated: >60 mL/min
Glucose, Bld: 101 mg/dL — ABNORMAL HIGH (ref 70–99)
Potassium: 4 mmol/L (ref 3.5–5.1)
Sodium: 139 mmol/L (ref 135–145)
Total Bilirubin: 0.3 mg/dL (ref 0.3–1.2)
Total Protein: 6.5 g/dL (ref 6.5–8.1)

## 2020-10-15 LAB — URINALYSIS, ROUTINE W REFLEX MICROSCOPIC
Bilirubin Urine: NEGATIVE
Glucose, UA: NEGATIVE mg/dL
Hgb urine dipstick: NEGATIVE
Ketones, ur: NEGATIVE mg/dL
Leukocytes,Ua: NEGATIVE
Nitrite: NEGATIVE
Protein, ur: NEGATIVE mg/dL
Specific Gravity, Urine: 1.025 (ref 1.005–1.030)
pH: 6 (ref 5.0–8.0)

## 2020-10-15 LAB — CBC
HCT: 38.7 % (ref 36.0–46.0)
Hemoglobin: 12.6 g/dL (ref 12.0–15.0)
MCH: 28.4 pg (ref 26.0–34.0)
MCHC: 32.6 g/dL (ref 30.0–36.0)
MCV: 87.2 fL (ref 80.0–100.0)
Platelets: 244 10*3/uL (ref 150–400)
RBC: 4.44 MIL/uL (ref 3.87–5.11)
RDW: 14.6 % (ref 11.5–15.5)
WBC: 10 10*3/uL (ref 4.0–10.5)
nRBC: 0 % (ref 0.0–0.2)

## 2020-10-15 LAB — RESP PANEL BY RT-PCR (FLU A&B, COVID) ARPGX2
Influenza A by PCR: NEGATIVE
Influenza B by PCR: NEGATIVE
SARS Coronavirus 2 by RT PCR: NEGATIVE

## 2020-10-15 LAB — ETHANOL: Alcohol, Ethyl (B): <10 mg/dL (ref <10)

## 2020-10-15 LAB — PROTIME-INR
INR: 0.9 (ref 0.8–1.2)
Prothrombin Time: 12.6 seconds (ref 11.4–15.2)

## 2020-10-15 LAB — PREGNANCY, URINE: Preg Test, Ur: NEGATIVE

## 2020-10-15 MED ORDER — MORPHINE SULFATE (PF) 4 MG/ML IV SOLN
4.0000 mg | Freq: Once | INTRAVENOUS | Status: AC
  Administered 2020-10-15: 4 mg via INTRAVENOUS
  Filled 2020-10-15: qty 1

## 2020-10-15 NOTE — ED Notes (Signed)
Patient transported to CT 

## 2020-10-15 NOTE — ED Triage Notes (Signed)
Pt states she fell in bathtub last night-pain to left leg, right arm and lower back-NAD-to triage in w/c

## 2020-10-15 NOTE — Discharge Instructions (Addendum)
Recommend rest, intermittent ice/heating, NSAIDs for pain control. Return to the ED for ANY red flag symptoms: New weakness, urinary or fecal incontinence, groin numbness.

## 2020-10-15 NOTE — ED Provider Notes (Signed)
[facility] EMERGENCY DEPARTMENT Provider Note   CSN: [id number] Arrival date & time: 10/15/20  1213     History Chief Complaint  Patient presents with   Marletta Lor    Maria Bailey is a 50 y.o. female.   Fall Pertinent negatives include no chest pain, no abdominal pain and no shortness of breath.   50 year old female who presents to the emergency department with neck pain, back pain, new weakness after a fall in the bathtub last night.  The patient arrived as a nonlevel trauma.  She was upgraded to a level 2 trauma given her new neurologic deficit in the setting of a fall.  She states that she slipped in the bathtub last night and landed on her neck and back.  She also states "I twisted my ankle and leg funny."  She denies striking her head or losing consciousness.  On arrival, she was tearful and complaining of severe pain in her neck and back.  She was GCS 15, ABC intact.  Past Medical History:  Diagnosis Date   Back pain    Dysesthesia    Face pain    History of stomach ulcers    Neck pain    Right arm pain    Right leg pain     Patient Active Problem List   Diagnosis Date Noted   Concussion 07/23/2018   Right leg pain    Back pain    Dysesthesia     Past Surgical History:  Procedure Laterality Date   c sections     x2   COLONOSCOPY       OB History   No obstetric history on file.     Family History  Problem Relation Age of Onset   Diabetes Father    Cancer Maternal Grandmother    Cancer Maternal Grandfather    Fibromyalgia Sister     Social History   Tobacco Use   Smoking status: Every Day    Packs/day: 1.00    Types: Cigarettes   Smokeless tobacco: Never  Substance Use Topics   Alcohol use: Yes    Comment: occ   Drug use: No    Home Medications Prior to Admission medications   Medication Sig Start Date End Date Taking? Authorizing Provider  cefdinir (OMNICEF) 300 MG capsule Take 1 capsule (300 mg total) by mouth 2 (two) times  daily. 04/04/20   Horton, Mayer Masker, MD  Multiple Vitamin (MULTIVITAMIN WITH MINERALS) TABS tablet Take 1 tablet by mouth daily.    [provider]  pantoprazole (PROTONIX) 40 MG tablet Take 40 mg by mouth 2 (two) times daily.    [provider]    Allergies    Amoxicillin, Penicillins, and Other  Review of Systems   Review of Systems  Constitutional:  Negative for chills and fever.  HENT:  Negative for ear pain and sore throat.   Eyes:  Negative for pain and visual disturbance.  Respiratory:  Negative for cough and shortness of breath.   Cardiovascular:  Negative for chest pain and palpitations.  Gastrointestinal:  Negative for abdominal pain and vomiting.  Genitourinary:  Negative for dysuria and hematuria.  Musculoskeletal:  Positive for arthralgias, back pain and neck pain.  Skin:  Negative for color change and rash.  Neurological:  Positive for weakness. Negative for seizures, syncope and numbness.  All other systems reviewed and are negative.  Physical Exam Updated Vital Signs BP 132/67 (BP Location: Right Arm)   Pulse (!) 56  Temp 98.3 F (36.8 C) (Oral)   Resp 16   Ht 5\' 2"  (1.575 m)   Wt 83.9 kg   LMP 08/20/2020 Comment: Patient has not been sexually active in years per patient  SpO2 97%   BMI 33.84 kg/m   Physical Exam Vitals and nursing note reviewed.  Constitutional:      General: She is not in acute distress.    Appearance: She is well-developed.     Comments: GCS 15, ABC intact  HENT:     Head: Normocephalic and atraumatic.  Eyes:     Extraocular Movements: Extraocular movements intact.     Conjunctiva/sclera: Conjunctivae normal.     Pupils: Pupils are equal, round, and reactive to light.  Neck:     Comments: Tenderness to palpation of the midline of the cervical spine.  Range of motion appears to be limited by pain.  C-collar placed on my examination Cardiovascular:     Rate and Rhythm: Normal rate and regular rhythm.     Heart  sounds: No murmur heard. Pulmonary:     Effort: Pulmonary effort is normal. No respiratory distress.     Breath sounds: Normal breath sounds.  Chest:     Comments: Clavicles stable nontender to AP compression.  Chest wall stable and nontender to AP and lateral compression. Abdominal:     Palpations: Abdomen is soft.     Tenderness: There is no abdominal tenderness.  Musculoskeletal:     Cervical back: Neck supple.     Comments: No midline tenderness to palpation of the thoracic or lumbar spine.  Tenderness of the bilateral malleoli of the left ankle.  2+ DP pulses.  Left hip tenderness to palpation with intact range of motion on passive flexion and extension.  Skin:    General: Skin is warm and dry.  Neurological:     Mental Status: She is alert.     Comments: Cranial nerves II through XII grossly intact.  Moving all 4 extremities spontaneously.  Sensation grossly intact all 4 extremities.  Patient appears to be focally weak in the right upper extremity and left lower extremity.    ED Results / Procedures / Treatments   Labs (all labs ordered are listed, but only abnormal results are displayed) Labs Reviewed  COMPREHENSIVE METABOLIC PANEL - Abnormal; Notable for the following components:      Result Value   Glucose, Bld 101 (*)    BUN 23 (*)    Creatinine, Ser 1.10 (*)    AST 13 (*)    All other components within normal limits  RESP PANEL BY RT-PCR (FLU A&B, COVID) ARPGX2  CBC  ETHANOL  URINALYSIS, ROUTINE W REFLEX MICROSCOPIC  PROTIME-INR  PREGNANCY, URINE    EKG None  Radiology DG Ankle Complete Left  Result Date: 10/15/2020 CLINICAL DATA:  Status post fall.  Ankle pain EXAM: LEFT ANKLE COMPLETE - 3+ VIEW COMPARISON:  None FINDINGS: There is no evidence of fracture, dislocation, or joint effusion. Posterior calcaneal spur noted. Plantar calcaneal spur also noted. A curvilinear density along the posterior distal tibia of unclear etiology-may be due to overlapping  osseous structures. There is no evidence of arthropathy or other focal bone abnormality. Soft tissues are unremarkable. IMPRESSION: No definite acute displaced fracture or dislocation. Electronically Signed   By: 12/15/2020 M.D.   On: 10/15/2020 15:55   CT HEAD WO CONTRAST  Result Date: 10/15/2020 CLINICAL DATA:  Head trauma, focal neuro findings (Age 73-64y). Neck trauma, impaired ROM (Age  16-64y). Neck trauma, midline tenderness (Age 86-64y). Left leg, right arm, and lower back pain after falling in the bathtub. EXAM: CT HEAD WITHOUT CONTRAST CT CERVICAL SPINE WITHOUT CONTRAST TECHNIQUE: Multidetector CT imaging of the head and cervical spine was performed following the standard protocol without intravenous contrast. Multiplanar CT image reconstructions of the cervical spine were also generated. COMPARISON:  Head MRI 09/17/2019.  Head and neck CTA 09/16/2019. FINDINGS: CT HEAD FINDINGS Brain: There is no evidence of an acute infarct, intracranial hemorrhage, mass, midline shift, or extra-axial fluid collection. The ventricles and sulci are normal. Slight chronic cerebellar tonsillar ectopia is partially visualized. Vascular: No hyperdense vessel. Skull: No fracture or suspicious osseous lesion. Sinuses/Orbits: Mucous retention cyst in the right maxillary sinus. Mild mucosal thickening in the ethmoid air cells bilaterally. Clear mastoid air cells. Unremarkable orbits. Other: None. CT CERVICAL SPINE FINDINGS Alignment: Straightening of the normal cervical lordosis. No listhesis. Skull base and vertebrae: No acute fracture or suspicious osseous lesion. Unchanged well-circumscribed subcentimeter lucency in the C4 vertebral body, benign in appearance. Soft tissues and spinal canal: No prevertebral fluid or swelling. No visible canal hematoma. Disc levels: Cervical spondylosis and facet arthrosis with evidence of mild spinal stenosis at C3-4 and C5-6 and asymmetrically severe right neural foraminal stenosis at  C3-4. Upper chest: Clear lung apices. Other: None. IMPRESSION: 1. No evidence of acute intracranial abnormality. 2. No acute cervical spine fracture. Electronically Signed   By: Sebastian Ache M.D.   On: 10/15/2020 14:12   CT CERVICAL SPINE WO CONTRAST  Result Date: 10/15/2020 CLINICAL DATA:  Head trauma, focal neuro findings (Age 58-64y). Neck trauma, impaired ROM (Age 32-64y). Neck trauma, midline tenderness (Age 86-64y). Left leg, right arm, and lower back pain after falling in the bathtub. EXAM: CT HEAD WITHOUT CONTRAST CT CERVICAL SPINE WITHOUT CONTRAST TECHNIQUE: Multidetector CT imaging of the head and cervical spine was performed following the standard protocol without intravenous contrast. Multiplanar CT image reconstructions of the cervical spine were also generated. COMPARISON:  Head MRI 09/17/2019.  Head and neck CTA 09/16/2019. FINDINGS: CT HEAD FINDINGS Brain: There is no evidence of an acute infarct, intracranial hemorrhage, mass, midline shift, or extra-axial fluid collection. The ventricles and sulci are normal. Slight chronic cerebellar tonsillar ectopia is partially visualized. Vascular: No hyperdense vessel. Skull: No fracture or suspicious osseous lesion. Sinuses/Orbits: Mucous retention cyst in the right maxillary sinus. Mild mucosal thickening in the ethmoid air cells bilaterally. Clear mastoid air cells. Unremarkable orbits. Other: None. CT CERVICAL SPINE FINDINGS Alignment: Straightening of the normal cervical lordosis. No listhesis. Skull base and vertebrae: No acute fracture or suspicious osseous lesion. Unchanged well-circumscribed subcentimeter lucency in the C4 vertebral body, benign in appearance. Soft tissues and spinal canal: No prevertebral fluid or swelling. No visible canal hematoma. Disc levels: Cervical spondylosis and facet arthrosis with evidence of mild spinal stenosis at C3-4 and C5-6 and asymmetrically severe right neural foraminal stenosis at C3-4. Upper chest: Clear  lung apices. Other: None. IMPRESSION: 1. No evidence of acute intracranial abnormality. 2. No acute cervical spine fracture. Electronically Signed   By: Sebastian Ache M.D.   On: 10/15/2020 14:12   CT Thoracic Spine Wo Contrast  Result Date: 10/15/2020 CLINICAL DATA:  Trunk numbness or tingling. Left leg, right arm, and lower back pain after falling in the bathtub. EXAM: CT THORACIC SPINE WITHOUT CONTRAST TECHNIQUE: Multidetector CT images of the thoracic were obtained using the standard protocol without intravenous contrast. COMPARISON:  Thoracic spine radiograph 07/18/2018 FINDINGS: Alignment: Normal.  Vertebrae: No acute fracture or suspicious osseous lesion. Paraspinal and other soft tissues: Unremarkable. Disc levels: Mild thoracic spondylosis. Mild-to-moderate mid upper thoracic facet arthrosis. No evidence of high-grade stenosis. IMPRESSION: No acute osseous abnormality identified in the thoracic spine. Electronically Signed   By: Sebastian Ache M.D.   On: 10/15/2020 14:03   CT Lumbar Spine Wo Contrast  Result Date: 10/15/2020 CLINICAL DATA:  Spine fracture, lumbosacral, traumatic. Left leg, right arm, and low back pain after falling in a bathtub. EXAM: CT LUMBAR SPINE WITHOUT CONTRAST TECHNIQUE: Multidetector CT imaging of the lumbar spine was performed without intravenous contrast administration. Multiplanar CT image reconstructions were also generated. COMPARISON:  CT abdomen and pelvis 09/07/2015 FINDINGS: Segmentation: 5 lumbar type vertebrae.  Hypoplastic ribs at T12. Alignment: Normal. Vertebrae: No acute fracture or suspicious osseous lesion. Paraspinal and other soft tissues: No acute abnormality in the paraspinal soft tissues. Disc levels: Mild lumbar spondylosis. Multilevel facet arthrosis, severe on the right at L4-5 and moderate bilaterally at L5-S1. Mild-to-moderate left greater than right neural foraminal stenosis at L4-5 due to disc bulging and facet arthrosis. No evidence of high-grade  spinal stenosis. IMPRESSION: No acute osseous abnormality identified in the lumbar spine. Electronically Signed   By: Sebastian Ache M.D.   On: 10/15/2020 14:17   DG Pelvis Portable  Result Date: 10/15/2020 CLINICAL DATA:  Fall, pelvic pain, left hip pain EXAM: PORTABLE PELVIS 1-2 VIEWS COMPARISON:  None. FINDINGS: There is no evidence of pelvic fracture or diastasis. No pelvic bone lesions are seen. IMPRESSION: Negative. Electronically Signed   By: Helyn Numbers M.D.   On: 10/15/2020 14:35   DG Chest Port 1 View  Result Date: 10/15/2020 CLINICAL DATA:  Fall, chest injury EXAM: PORTABLE CHEST 1 VIEW COMPARISON:  01/25/2020 FINDINGS: The heart size and mediastinal contours are within normal limits. Both lungs are clear. The visualized skeletal structures are unremarkable. IMPRESSION: No active disease. Electronically Signed   By: Helyn Numbers M.D.   On: 10/15/2020 14:32    Procedures Procedures   Medications Ordered in ED Medications  morphine 4 MG/ML injection 4 mg (4 mg Intravenous Given 10/15/20 1419)    ED Course  I have reviewed the triage vital signs and the nursing notes.  Pertinent labs & imaging results that were available during my care of the patient were reviewed by me and considered in my medical decision making (see chart for details).    MDM Rules/Calculators/A&P                           50 year old female who presents to the emergency department with neck pain, back pain, new weakness after a fall in the bathtub last night.  The patient arrived as a nonlevel trauma.  She was upgraded to a level 2 trauma given her new neurologic deficit in the setting of a fall.  She states that she slipped in the bathtub last night and landed on her neck and back.  She also states "I twisted my ankle and leg funny."  She denies striking her head or losing consciousness.  On arrival, she was tearful and complaining of severe pain in her neck and back.  She was GCS 15, ABC intact.  Imaging  work-up obtained to include CT head, CT cervical spine, CT thoracic and lumbar spine, x-ray of the chest and pelvis, x-ray of the left ankle.  Imaging of the head negative for acute intracranial abnormality.  Cervical spine imaging negative  for acute fracture or malalignment.  Thoracic and lumbar spine imaging negative.  Chest and pelvis imaging also resulted without acute injury.  Ankle x-ray without evidence of acute fracture.  The patient has been administered IV morphine for pain control.  On my reassessment, her apparent weakness has resolved.  Symptoms may have been more due to acute pain than true neurologic deficit.  With the patient's pain better controlled, repeat palpation of her cervical spine allowed for clearance by Nexus criteria.  Imaging reviewed of the patient's cervical spine with no evidence of fracture or dislocation.  The patient was ultimately ambulatory in the emergency department and able to bear weight with no other injuries identified on primary or secondary survey, no concern for acute injury identified on imaging.  Repeat neurologic exam without focal weakness.  At this time, low concern for acute spinal cord injury given resolution of symptoms.  I do not think MRI imaging would be of benefit at this time.  Low concern for central cord syndrome or other spinal cord abnormality in the setting of the patient's repeat exam.  Overall stable for discharge home with return precautions provided.  Final Clinical Impression(s) / ED Diagnoses Final diagnoses:  Fall  Strain of neck muscle, initial encounter  Acute back pain, unspecified back location, unspecified back pain laterality  Acute left ankle pain    Rx / DC Orders ED Discharge Orders     None        Ernie AvenaLawsing, Nonna Renninger, MD 10/15/20 2238

## 2021-03-12 ENCOUNTER — Ambulatory Visit (INDEPENDENT_AMBULATORY_CARE_PROVIDER_SITE_OTHER): Payer: BC Managed Care – PPO | Admitting: Registered Nurse

## 2021-03-12 ENCOUNTER — Encounter: Payer: Self-pay | Admitting: Registered Nurse

## 2021-03-12 VITALS — BP 127/69 | HR 86 | Temp 98.2°F | Resp 18 | Ht <= 58 in | Wt 189.0 lb

## 2021-03-12 DIAGNOSIS — G252 Other specified forms of tremor: Secondary | ICD-10-CM | POA: Diagnosis not present

## 2021-03-12 DIAGNOSIS — Z8739 Personal history of other diseases of the musculoskeletal system and connective tissue: Secondary | ICD-10-CM

## 2021-03-12 DIAGNOSIS — Z13228 Encounter for screening for other metabolic disorders: Secondary | ICD-10-CM

## 2021-03-12 DIAGNOSIS — Z130 Encounter for screening for diseases of the blood and blood-forming organs and certain disorders involving the immune mechanism: Secondary | ICD-10-CM

## 2021-03-12 DIAGNOSIS — Z1329 Encounter for screening for other suspected endocrine disorder: Secondary | ICD-10-CM

## 2021-03-12 DIAGNOSIS — R5382 Chronic fatigue, unspecified: Secondary | ICD-10-CM

## 2021-03-12 DIAGNOSIS — Z13220 Encounter for screening for lipoid disorders: Secondary | ICD-10-CM

## 2021-03-12 MED ORDER — PANTOPRAZOLE SODIUM 40 MG PO TBEC: 40.0000 mg | DELAYED_RELEASE_TABLET | Freq: Two times a day (BID) | ORAL | 0 refills | Status: DC

## 2021-03-12 NOTE — Patient Instructions (Addendum)
Great to speak with you,  Labs here or at [address] - if you want to come here, give [place] a call ahead of time Fasting for at least 8 hours before labs  I'll let you know how results look  Thank you,  Rich     If you have lab work done today you will be contacted with your lab results within the next 2 weeks.  If you have not heard from [place] then please contact us. The fastest way to get your results is to register for My Chart.   IF you received an x-ray today, you will receive an invoice from [facility]. Please contact [facility] at [phone] with questions or concerns regarding your invoice.   IF you received labwork today, you will receive an invoice from [place]. Please contact [facility] at [phone] with questions or concerns regarding your invoice.   Our billing staff will not be able to assist you with questions regarding bills from these companies.  You will be contacted with the lab results as soon as they are available. The fastest way to get your results is to activate your My Chart account. Instructions are located on the last page of this paperwork. If you have not heard from [place] regarding the results in 2 weeks, please contact this office.

## 2021-03-12 NOTE — Progress Notes (Signed)
New Patient Office Visit  Subjective:  Patient ID: Maria Bailey, female    DOB: May 29, 1970  Age: 51 y.o. MRN: [id number]  CC:  Chief Complaint  Patient presents with   New Patient (Initial Visit)    Patient states she is here to establish care. Patient states she need refill on omeprazole    HPI CAYA SOBERANIS presents for visit to est care.  Histories reviewed and updated with patient.   GERD Needs refill on omeprazole Uses most days Takes steps to address lifestyle control of GERD  Notes hx of RA Aches, pains, and slow to rise Notes she does work 52-60 hours a week.  Hx of PFO No ongoing concerns.  As needed follow up with cardiology  Tremor Ongoing, worse at rest. R>L. Has been ongoing for a few months, worsening it seems No headache or other neuro / cognitive changes to correlate with this.  Otherwise no acute concerns.   Past Medical History:  Diagnosis Date   Back pain    Concussion    Dysesthesia    Face pain    GERD (gastroesophageal reflux disease)    History of stomach ulcers    Neck pain    Rheumatoid arthritis (HCC)    Right arm pain    Right leg pain     Past Surgical History:  Procedure Laterality Date   c sections     x2   COLONOSCOPY      Family History  Problem Relation Age of Onset   Diabetes Father    Cancer Maternal Grandmother    Cancer Maternal Grandfather    Fibromyalgia Sister     Social History   Socioeconomic History   Marital status: Divorced    Spouse name: Not on file   Number of children: Not on file   Years of education: Not on file   Highest education level: Not on file  Occupational History   Not on file  Tobacco Use   Smoking status: Every Day    Packs/day: 1.00    Types: Cigarettes   Smokeless tobacco: Never  Vaping Use   Vaping Use: Never used  Substance and Sexual Activity   Alcohol use: Yes    Comment: occ   Drug use: No   Sexual activity: Not on file  Other Topics Concern   Not on file   Social History Narrative   Not on file   Social Determinants of Health   Financial Resource Strain: Not on file  Food Insecurity: Not on file  Transportation Needs: Not on file  Physical Activity: Not on file  Stress: Not on file  Social Connections: Not on file  Intimate Partner Violence: Not on file    ROS Review of Systems  Constitutional: Negative.   HENT: Negative.    Eyes: Negative.   Respiratory: Negative.    Cardiovascular: Negative.   Gastrointestinal: Negative.   Genitourinary: Negative.   Musculoskeletal: Negative.   Skin: Negative.   Neurological: Negative.   Psychiatric/Behavioral: Negative.    All other systems reviewed and are negative.  Objective:   Today's Vitals: BP 127/69    Pulse 86    Temp 98.2 F (36.8 C) (Temporal)    Resp 18    Ht 1' (0.305 m)    Wt 189 lb (85.7 kg)    SpO2 97%    BMI 922.79 kg/m   Physical Exam Vitals and nursing note reviewed.  Constitutional:      General: She is  not in acute distress.    Appearance: Normal appearance. She is not ill-appearing, toxic-appearing or diaphoretic.  Cardiovascular:     Rate and Rhythm: Normal rate and regular rhythm.     Pulses: Normal pulses.     Heart sounds: Normal heart sounds. No murmur heard.   No friction rub. No gallop.  Pulmonary:     Effort: Pulmonary effort is normal. No respiratory distress.     Breath sounds: Normal breath sounds. No stridor. No wheezing, rhonchi or rales.  Chest:     Chest wall: No tenderness.  Skin:    General: Skin is warm and dry.     Capillary Refill: Capillary refill takes less than 2 seconds.  Neurological:     Mental Status: She is alert and oriented to person, place, and time. Mental status is at baseline.     Cranial Nerves: No cranial nerve deficit.     Sensory: No sensory deficit.     Motor: No weakness.     Coordination: Coordination normal.     Gait: Gait normal.     Deep Tendon Reflexes: Reflexes normal.     Comments: Resting r side tremor   Psychiatric:        Mood and Affect: Mood normal.        Behavior: Behavior normal.        Thought Content: Thought content normal.        Judgment: Judgment normal.    Assessment & Plan:   Problem List Items Addressed This Visit   None Visit Diagnoses     Screening for endocrine, metabolic and immunity disorder    -  Primary   Relevant Orders   CBC with Differential/Platelet (Completed)   Comprehensive metabolic panel (Completed)   Hemoglobin A1c (Completed)   TSH (Completed)   Lipid screening       Relevant Orders   Lipid panel (Completed)   Chronic fatigue       Relevant Orders   CBC with Differential/Platelet (Completed)   Comprehensive metabolic panel (Completed)   Hemoglobin A1c (Completed)   Lipid panel (Completed)   TSH (Completed)   Vitamin D (25 hydroxy) (Completed)   B12 and Folate Panel (Completed)   History of rheumatoid arthritis       Relevant Orders   ANA Screen,IFA,Reflex Titer/Pattern,Reflex Mplx 11 Ab Cascade with IdentRA       Outpatient Encounter Medications as of 03/12/2021  Medication Sig   cefdinir (OMNICEF) 300 MG capsule Take 1 capsule (300 mg total) by mouth 2 (two) times daily.   Multiple Vitamin (MULTIVITAMIN WITH MINERALS) TABS tablet Take 1 tablet by mouth daily.   pantoprazole (PROTONIX) 40 MG tablet Take 1 tablet (40 mg total) by mouth 2 (two) times daily.   [DISCONTINUED] pantoprazole (PROTONIX) 40 MG tablet Take 40 mg by mouth 2 (two) times daily.   No facility-administered encounter medications on file as of 03/12/2021.    Follow-up: Return in about 6 months (around 09/09/2021).   PLAN Labs collected. Will follow up with the patient as warranted. Collect ANA given reported hx of RA. Refill omeprazole Refer to neuro for tremor.  Patient encouraged to call clinic with any questions, comments, or concerns.  Janeece Agee, NP

## 2021-03-22 ENCOUNTER — Other Ambulatory Visit (INDEPENDENT_AMBULATORY_CARE_PROVIDER_SITE_OTHER): Payer: BC Managed Care – PPO

## 2021-03-22 ENCOUNTER — Other Ambulatory Visit: Payer: Self-pay | Admitting: Registered Nurse

## 2021-03-22 DIAGNOSIS — Z13220 Encounter for screening for lipoid disorders: Secondary | ICD-10-CM

## 2021-03-22 DIAGNOSIS — Z1329 Encounter for screening for other suspected endocrine disorder: Secondary | ICD-10-CM | POA: Diagnosis not present

## 2021-03-22 DIAGNOSIS — Z130 Encounter for screening for diseases of the blood and blood-forming organs and certain disorders involving the immune mechanism: Secondary | ICD-10-CM

## 2021-03-22 DIAGNOSIS — Z13228 Encounter for screening for other metabolic disorders: Secondary | ICD-10-CM

## 2021-03-22 DIAGNOSIS — Z8739 Personal history of other diseases of the musculoskeletal system and connective tissue: Secondary | ICD-10-CM

## 2021-03-22 DIAGNOSIS — R5382 Chronic fatigue, unspecified: Secondary | ICD-10-CM | POA: Diagnosis not present

## 2021-03-22 DIAGNOSIS — E1169 Type 2 diabetes mellitus with other specified complication: Secondary | ICD-10-CM

## 2021-03-22 DIAGNOSIS — E119 Type 2 diabetes mellitus without complications: Secondary | ICD-10-CM

## 2021-03-22 LAB — VITAMIN D 25 HYDROXY (VIT D DEFICIENCY, FRACTURES): VITD: 72.91 ng/mL (ref 30.00–100.00)

## 2021-03-22 LAB — LIPID PANEL
Cholesterol: 170 mg/dL (ref 0–200)
HDL: 39.3 mg/dL (ref >39.00)
LDL Cholesterol: 106 mg/dL — ABNORMAL HIGH (ref 0–99)
NonHDL: 130.23
Total CHOL/HDL Ratio: 4
Triglycerides: 119 mg/dL (ref 0.0–149.0)
VLDL: 23.8 mg/dL (ref 0.0–40.0)

## 2021-03-22 LAB — B12 AND FOLATE PANEL
Folate: >24.2 ng/mL (ref >5.9)
Vitamin B-12: 505 pg/mL (ref 211–911)

## 2021-03-22 LAB — CBC WITH DIFFERENTIAL/PLATELET
Basophils Absolute: 0.1 10*3/uL (ref 0.0–0.1)
Basophils Relative: 0.8 % (ref 0.0–3.0)
Eosinophils Absolute: 0.1 10*3/uL (ref 0.0–0.7)
Eosinophils Relative: 1.9 % (ref 0.0–5.0)
HCT: 39.6 % (ref 36.0–46.0)
Hemoglobin: 13.1 g/dL (ref 12.0–15.0)
Lymphocytes Relative: 21.1 % (ref 12.0–46.0)
Lymphs Abs: 1.6 10*3/uL (ref 0.7–4.0)
MCHC: 33.1 g/dL (ref 30.0–36.0)
MCV: 84.9 fl (ref 78.0–100.0)
Monocytes Absolute: 0.3 10*3/uL (ref 0.1–1.0)
Monocytes Relative: 3.8 % (ref 3.0–12.0)
Neutro Abs: 5.6 10*3/uL (ref 1.4–7.7)
Neutrophils Relative %: 72.4 % (ref 43.0–77.0)
Platelets: 214 10*3/uL (ref 150.0–400.0)
RBC: 4.67 Mil/uL (ref 3.87–5.11)
RDW: 14.3 % (ref 11.5–15.5)
WBC: 7.8 10*3/uL (ref 4.0–10.5)

## 2021-03-22 LAB — COMPREHENSIVE METABOLIC PANEL
ALT: 9 U/L (ref 0–35)
AST: 11 U/L (ref 0–37)
Albumin: 4.2 g/dL (ref 3.5–5.2)
Alkaline Phosphatase: 60 U/L (ref 39–117)
BUN: 21 mg/dL (ref 6–23)
CO2: 30 mEq/L (ref 19–32)
Calcium: 9.1 mg/dL (ref 8.4–10.5)
Chloride: 106 mEq/L (ref 96–112)
Creatinine, Ser: 0.95 mg/dL (ref 0.40–1.20)
GFR: 70.1 mL/min (ref >60.00)
Glucose, Bld: 95 mg/dL (ref 70–99)
Potassium: 4.2 mEq/L (ref 3.5–5.1)
Sodium: 140 mEq/L (ref 135–145)
Total Bilirubin: 0.4 mg/dL (ref 0.2–1.2)
Total Protein: 6.7 g/dL (ref 6.0–8.3)

## 2021-03-22 LAB — HEMOGLOBIN A1C: Hgb A1c MFr Bld: 6.7 % — ABNORMAL HIGH (ref 4.6–6.5)

## 2021-03-22 LAB — TSH: TSH: 1.58 u[IU]/mL (ref 0.35–5.50)

## 2021-03-22 MED ORDER — ROSUVASTATIN CALCIUM 5 MG PO TABS: 5.0000 mg | ORAL_TABLET | Freq: Every day | ORAL | 3 refills | Status: DC

## 2021-03-22 MED ORDER — METFORMIN HCL 500 MG PO TABS: 500.0000 mg | ORAL_TABLET | Freq: Two times a day (BID) | ORAL | 3 refills | Status: DC

## 2021-03-30 LAB — ANA SCREEN,IFA,REFLEX TITER/PATTERN,REFLEX MPLX 11 AB CASCADE
14-3-3 eta Protein: <0.2 ng/mL (ref <0.2)
Anti Nuclear Antibody (ANA): NEGATIVE
Cyclic Citrullin Peptide Ab: <16 UNITS
Rheumatoid fact SerPl-aCnc: <14 IU/mL (ref <14)

## 2021-04-10 ENCOUNTER — Other Ambulatory Visit: Payer: Self-pay

## 2021-04-10 ENCOUNTER — Encounter: Payer: Self-pay | Admitting: Registered Nurse

## 2021-04-10 ENCOUNTER — Ambulatory Visit (INDEPENDENT_AMBULATORY_CARE_PROVIDER_SITE_OTHER): Payer: BC Managed Care – PPO | Admitting: Registered Nurse

## 2021-04-10 VITALS — BP 123/63 | HR 80 | Temp 97.6°F | Resp 18 | Ht 62.0 in | Wt 189.0 lb

## 2021-04-10 DIAGNOSIS — Z1211 Encounter for screening for malignant neoplasm of colon: Secondary | ICD-10-CM

## 2021-04-10 DIAGNOSIS — E119 Type 2 diabetes mellitus without complications: Secondary | ICD-10-CM | POA: Diagnosis not present

## 2021-04-10 DIAGNOSIS — Z23 Encounter for immunization: Secondary | ICD-10-CM | POA: Diagnosis not present

## 2021-04-10 LAB — GLUCOSE, POCT (MANUAL RESULT ENTRY): POC Glucose: 101 mg/dl — AB (ref 70–99)

## 2021-04-10 MED ORDER — BLOOD GLUCOSE MONITOR KIT: PACK | 0 refills | Status: DC

## 2021-04-10 NOTE — Patient Instructions (Addendum)
Ms. Kartchner - ? ?Great to see you ? ?Check sugars 1-3 times a day randomly  ?Let me know about numbers below 75 and above 250 ? ?Keep follow up as scheduled. ? ?Thanks, ? ?Rich  ? ? ? ?If you have lab work done today you will be contacted with your lab results within the next 2 weeks.  If you have not heard from [place] then please contact us. The fastest way to get your results is to register for My Chart. ? ? ?IF you received an x-ray today, you will receive an invoice from [facility]. Please contact [facility] at [phone] with questions or concerns regarding your invoice.  ? ?IF you received labwork today, you will receive an invoice from [place]. Please contact [facility] at [phone] with questions or concerns regarding your invoice.  ? ?Our billing staff will not be able to assist you with questions regarding bills from these companies. ? ?You will be contacted with the lab results as soon as they are available. The fastest way to get your results is to activate your My Chart account. Instructions are located on the last page of this paperwork. If you have not heard from [place] regarding the results in 2 weeks, please contact this office. ?  ? ? ?

## 2021-04-12 NOTE — Progress Notes (Signed)
? ?Established Patient Office Visit ? ?Subjective:  ?Patient ID: Maria Bailey, female    DOB: 01-Oct-1970  Age: 51 y.o. MRN: [id number] ? ?CC:  ?Chief Complaint  ?Patient presents with  ? Follow-up  ?  Patient states she is here to follow up for a medication recheck. Patient wants to discuss medications.  ? ? ?HPI ?Maria Bailey presents for follow up  ? ?Med recheck ?Has been on metformin for a1c 6.7 ?Notes she does feel sluggish, shakey, clammy at times ?Some fatigue ?No LOC, urinary symptoms, flank pain, or other concerns ? ?Interested in getting a glucometer to measure sugars.  ? ?Past Medical History:  ?Diagnosis Date  ? Back pain   ? Concussion   ? Dysesthesia   ? Face pain   ? GERD (gastroesophageal reflux disease)   ? History of stomach ulcers   ? Neck pain   ? Rheumatoid arthritis ([place])   ? Right arm pain   ? Right leg pain   ? ? ?Past Surgical History:  ?Procedure Laterality Date  ? c sections    ? x2  ? COLONOSCOPY    ? ? ?Family History  ?Problem Relation Age of Onset  ? Diabetes Father   ? Cancer Maternal Grandmother   ? Cancer Maternal Grandfather   ? Fibromyalgia Sister   ? ? ?Social History  ? ?Socioeconomic History  ? Marital status: Divorced  ?  Spouse name: Not on file  ? Number of children: Not on file  ? Years of education: Not on file  ? Highest education level: Not on file  ?Occupational History  ? Not on file  ?Tobacco Use  ? Smoking status: Every Day  ?  Packs/day: 1.00  ?  Types: Cigarettes  ? Smokeless tobacco: Never  ?Vaping Use  ? Vaping Use: Never used  ?Substance and Sexual Activity  ? Alcohol use: Yes  ?  Comment: occ  ? Drug use: No  ? Sexual activity: Not on file  ?Other Topics Concern  ? Not on file  ?Social History Narrative  ? Not on file  ? ?Social Determinants of Health  ? ?Financial Resource Strain: Not on file  ?Food Insecurity: Not on file  ?Transportation Needs: Not on file  ?Physical Activity: Not on file  ?Stress: Not on file  ?Social Connections: Not on file  ?Intimate  Partner Violence: Not on file  ? ? ?Outpatient Medications Prior to Visit  ?Medication Sig Dispense Refill  ? cefdinir (OMNICEF) 300 MG capsule Take 1 capsule (300 mg total) by mouth 2 (two) times daily. 20 capsule 0  ? metFORMIN (GLUCOPHAGE) 500 MG tablet Take 1 tablet (500 mg total) by mouth 2 (two) times daily with a meal. 180 tablet 3  ? Multiple Vitamin (MULTIVITAMIN WITH MINERALS) TABS tablet Take 1 tablet by mouth daily.    ? pantoprazole (PROTONIX) 40 MG tablet Take 1 tablet (40 mg total) by mouth 2 (two) times daily. 30 tablet 0  ? rosuvastatin (CRESTOR) 5 MG tablet Take 1 tablet (5 mg total) by mouth daily. 90 tablet 3  ? ?No facility-administered medications prior to visit.  ? ? ?Allergies  ?Allergen Reactions  ? Amoxicillin Anaphylaxis and Hives  ? Penicillins Anaphylaxis and Hives  ?  Has patient had a PCN reaction causing immediate rash, facial/tongue/throat swelling, SOB or lightheadedness with hypotension: Yes ?Has patient had a PCN reaction causing severe rash involving mucus membranes or skin necrosis: No ?Has patient had a PCN reaction that required  hospitalization 10 hr hospital visit ?Has patient had a PCN reaction occurring within the last 10 years: No ?If all of the above answers are "NO", then may proceed with Cephalosporin use.  ? Other Rash and Other (See Comments)  ?  Steroids cause palpitations  ? ? ?ROS ?Review of Systems  ?Constitutional: Negative.   ?HENT: Negative.    ?Eyes: Negative.   ?Respiratory: Negative.    ?Cardiovascular: Negative.   ?Gastrointestinal: Negative.   ?Genitourinary: Negative.   ?Musculoskeletal: Negative.   ?Skin: Negative.   ?Neurological: Negative.   ?Psychiatric/Behavioral: Negative.    ?All other systems reviewed and are negative. ? ?  ?Objective:  ?  ?Physical Exam ?Vitals and nursing note reviewed.  ?Constitutional:   ?   General: She is not in acute distress. ?   Appearance: Normal appearance. She is normal weight. She is not ill-appearing, toxic-appearing  or diaphoretic.  ?Cardiovascular:  ?   Rate and Rhythm: Normal rate and regular rhythm.  ?   Heart sounds: Normal heart sounds. No murmur heard. ?  No friction rub. No gallop.  ?Pulmonary:  ?   Effort: Pulmonary effort is normal. No respiratory distress.  ?   Breath sounds: Normal breath sounds. No stridor. No wheezing, rhonchi or rales.  ?Chest:  ?   Chest wall: No tenderness.  ?Skin: ?   General: Skin is warm and dry.  ?Neurological:  ?   General: No focal deficit present.  ?   Mental Status: She is alert and oriented to person, place, and time. Mental status is at baseline.  ?Psychiatric:     ?   Mood and Affect: Mood normal.     ?   Behavior: Behavior normal.     ?   Thought Content: Thought content normal.     ?   Judgment: Judgment normal.  ? ? ?BP 123/63   Pulse 80   Temp 97.6 ?F (36.4 ?C) (Temporal)   Resp 18   Ht _0  (1.575 m)   Wt 189 lb (85.7 kg)   SpO2 96%   BMI 34.57 kg/m?  ?Wt Readings from Last 3 Encounters:  ?04/10/21 189 lb (85.7 kg)  ?03/12/21 189 lb (85.7 kg)  ?10/15/20 185 lb (83.9 kg)  ? ? ? ?Health Maintenance Due  ?Topic Date Due  ? COVID-19 Vaccine (1) Never done  ? URINE MICROALBUMIN  Never done  ? HIV Screening  Never done  ? Hepatitis C Screening  Never done  ? PAP SMEAR-Modifier  Never done  ? COLONOSCOPY (Pts 45-93yr Insurance coverage will need to be confirmed)  Never done  ? ? ?There are no preventive care reminders to display for this patient. ? ?Lab Results  ?Component Value Date  ? TSH 1.58 03/22/2021  ? ?Lab Results  ?Component Value Date  ? WBC 7.8 03/22/2021  ? HGB 13.1 03/22/2021  ? HCT 39.6 03/22/2021  ? MCV 84.9 03/22/2021  ? PLT 214.0 03/22/2021  ? ?Lab Results  ?Component Value Date  ? NA 140 03/22/2021  ? K 4.2 03/22/2021  ? CO2 30 03/22/2021  ? GLUCOSE 95 03/22/2021  ? BUN 21 03/22/2021  ? CREATININE 0.95 03/22/2021  ? BILITOT 0.4 03/22/2021  ? ALKPHOS 60 03/22/2021  ? AST 11 03/22/2021  ? ALT 9 03/22/2021  ? PROT 6.7 03/22/2021  ? ALBUMIN 4.2 03/22/2021  ?  CALCIUM 9.1 03/22/2021  ? ANIONGAP 6 10/15/2020  ? GFR 70.10 03/22/2021  ? ?Lab Results  ?Component Value Date  ?  CHOL 170 03/22/2021  ? ?Lab Results  ?Component Value Date  ? HDL 39.30 03/22/2021  ? ?Lab Results  ?Component Value Date  ? LDLCALC 106 (H) 03/22/2021  ? ?Lab Results  ?Component Value Date  ? TRIG 119.0 03/22/2021  ? ?Lab Results  ?Component Value Date  ? CHOLHDL 4 03/22/2021  ? ?Lab Results  ?Component Value Date  ? HGBA1C 6.7 (H) 03/22/2021  ? ? ?  ?Assessment & Plan:  ? ?Problem List Items Addressed This Visit   ?None ?Visit Diagnoses   ? ? Need for shingles vaccine    -  Primary  ? Relevant Orders  ? Varicella-zoster vaccine IM (Completed)  ? Colon cancer screening      ? Relevant Orders  ? Ambulatory referral to Gastroenterology  ? Type 2 diabetes mellitus without complication, without long-term current use of insulin ([place])      ? Relevant Medications  ? blood glucose meter kit and supplies KIT  ? Other Relevant Orders  ? POCT Glucose (CBG) (Completed)  ? ?  ? ? ?Meds ordered this encounter  ?Medications  ? blood glucose meter kit and supplies KIT  ?  Sig: Dispense based on patient and insurance preference. Use up to four times daily as directed.  ?  Dispense:  1 each  ?  Refill:  0  ?  Order Specific Question:   Supervising Provider  ?  Answer:   Carlota Raspberry, JEFFREY R [[id number]]  ?  Order Specific Question:   Number of strips  ?  Answer:   300  ?  Order Specific Question:   Number of lancets  ?  Answer:   300  ? ? ?Follow-up: Return if symptoms worsen or fail to improve, for as scheduled.  ? ?PLAN ?Suspect hypoglycemia. Stop metformin. Disp glucometer and monitor sugars, diet, exercise. ?Return as scheduled. ?Shingrix #1 given today.  ?Refer to GI for colonoscopy. ?Patient encouraged to call clinic with any questions, comments, or concerns. ? ?Maximiano Coss, NP ?

## 2021-04-18 ENCOUNTER — Other Ambulatory Visit: Payer: Self-pay | Admitting: Registered Nurse

## 2021-04-18 MED ORDER — ACCU-CHEK GUIDE VI STRP: ORAL_STRIP | 12 refills | Status: DC

## 2021-04-18 MED ORDER — ACCU-CHEK SOFTCLIX LANCETS MISC: 12 refills | Status: DC

## 2021-04-18 MED ORDER — ACCU-CHEK GUIDE W/DEVICE KIT: 1.0000 | PACK | Freq: Once | 0 refills | Status: AC

## 2021-04-23 ENCOUNTER — Telehealth: Payer: Self-pay

## 2021-04-23 NOTE — Telephone Encounter (Signed)
Maria Bailey is calling in stating they need directions for the patients prescription Accu-Chek Softclix Lancets lancets // glucose blood (ACCU-CHEK GUIDE) test strip.  ?

## 2021-04-26 ENCOUNTER — Other Ambulatory Visit: Payer: Self-pay | Admitting: Registered Nurse

## 2021-04-26 DIAGNOSIS — E119 Type 2 diabetes mellitus without complications: Secondary | ICD-10-CM

## 2021-04-26 MED ORDER — ACCU-CHEK SOFTCLIX LANCETS MISC: 12 refills | Status: DC

## 2021-04-26 NOTE — Telephone Encounter (Signed)
Have re-sent ? ?Thanks, ? ?Rich

## 2021-05-02 ENCOUNTER — Other Ambulatory Visit: Payer: Self-pay | Admitting: Registered Nurse

## 2021-05-02 DIAGNOSIS — E119 Type 2 diabetes mellitus without complications: Secondary | ICD-10-CM

## 2021-05-02 MED ORDER — ACCU-CHEK GUIDE VI STRP: 1.0000 | ORAL_STRIP | Freq: Four times a day (QID) | 12 refills | Status: DC | PRN

## 2021-07-18 ENCOUNTER — Other Ambulatory Visit: Payer: Self-pay

## 2021-07-18 ENCOUNTER — Encounter (HOSPITAL_BASED_OUTPATIENT_CLINIC_OR_DEPARTMENT_OTHER): Payer: Self-pay | Admitting: Urology

## 2021-07-18 ENCOUNTER — Emergency Department (HOSPITAL_BASED_OUTPATIENT_CLINIC_OR_DEPARTMENT_OTHER)
Admission: EM | Admit: 2021-07-18 | Discharge: 2021-07-18 | Discharge status: Against Medical Advice | Payer: BC Managed Care – PPO | Attending: Emergency Medicine | Admitting: Emergency Medicine

## 2021-07-18 DIAGNOSIS — K0889 Other specified disorders of teeth and supporting structures: Secondary | ICD-10-CM | POA: Insufficient documentation

## 2021-07-18 DIAGNOSIS — Z5321 Procedure and treatment not carried out due to patient leaving prior to being seen by health care provider: Secondary | ICD-10-CM | POA: Insufficient documentation

## 2021-07-18 NOTE — ED Provider Notes (Signed)
Patient left before being evaluated by myself due to wait times.    Maria Bailey 07/18/21 1555    Derwood Kaplan, MD 07/19/21 [phone]

## 2021-07-18 NOTE — ED Triage Notes (Signed)
Left lower dental pain that started yesterday  No facial swelling at this time States pain radiating to left eye and left ear

## 2021-07-18 NOTE — ED Notes (Signed)
Pt requesting pain medication. Provider made aware.

## 2021-07-18 NOTE — ED Notes (Signed)
Provider to bedside, notified by registration that pt left prior to being seen, handed them her labels and left the department.

## 2021-07-19 ENCOUNTER — Encounter (HOSPITAL_BASED_OUTPATIENT_CLINIC_OR_DEPARTMENT_OTHER): Payer: Self-pay | Admitting: Emergency Medicine

## 2021-07-19 ENCOUNTER — Other Ambulatory Visit: Payer: Self-pay

## 2021-07-19 ENCOUNTER — Emergency Department (HOSPITAL_BASED_OUTPATIENT_CLINIC_OR_DEPARTMENT_OTHER)
Admission: EM | Admit: 2021-07-19 | Discharge: 2021-07-19 | Disposition: A | Discharge status: Home / Self Care | Payer: BC Managed Care – PPO | Attending: Emergency Medicine | Admitting: Emergency Medicine

## 2021-07-19 DIAGNOSIS — Z7984 Long term (current) use of oral hypoglycemic drugs: Secondary | ICD-10-CM | POA: Insufficient documentation

## 2021-07-19 DIAGNOSIS — K0889 Other specified disorders of teeth and supporting structures: Secondary | ICD-10-CM | POA: Diagnosis not present

## 2021-07-19 DIAGNOSIS — K047 Periapical abscess without sinus: Secondary | ICD-10-CM

## 2021-07-19 DIAGNOSIS — E119 Type 2 diabetes mellitus without complications: Secondary | ICD-10-CM | POA: Insufficient documentation

## 2021-07-19 MED ORDER — CLINDAMYCIN HCL 300 MG PO CAPS: 300.0000 mg | ORAL_CAPSULE | Freq: Three times a day (TID) | ORAL | 0 refills | Status: AC

## 2021-07-19 NOTE — Discharge Instructions (Addendum)
Take the antibiotics as prescribed.  May also take Tylenol or ibuprofen for pain.  Place heat to the area.  Follow-up with dentistry.  Return for any new or worsening symptoms

## 2021-07-19 NOTE — ED Provider Notes (Signed)
[facility] Provider Note   CSN: [id number] Arrival date & time: 07/19/21  1620     History  Chief Complaint  Patient presents with   Dental Pain    Maria Bailey is a 51 y.o. female past medical history significant for severe periodontal disease, DM here for evaluation of dental pain.  Pain began yesterday.  Came here to ED however left due to long wait.  States she noted some left-sided facial swelling earlier today.  She has history of recurrent dental infections.  She was previously seen by dentistry however stated that she need to have all of her teeth removed with significant work for dentistry.  She is unable to afford this.  No fever, facial redness.  No difficulty tolerating p.o. intake.  No neck pain, neck stiffness.  No meds PTA.  HPI     Home Medications Prior to Admission medications   Medication Sig Start Date End Date Taking? Authorizing Provider  clindamycin (CLEOCIN) 300 MG capsule Take 1 capsule (300 mg total) by mouth 3 (three) times daily for 7 days. 07/19/21 07/26/21 Yes Lydiann Bonifas A, PA-C  Accu-Chek Softclix Lancets lancets Use up to three times daily at distal end of finger to check capillary blood glucose. 04/26/21   Maximiano Coss, NP  blood glucose meter kit and supplies KIT Dispense based on patient and insurance preference. Use up to four times daily as directed. 04/10/21   Maximiano Coss, NP  cefdinir (OMNICEF) 300 MG capsule Take 1 capsule (300 mg total) by mouth 2 (two) times daily. 04/04/20   Horton, Barbette Hair, MD  glucose blood (ACCU-CHEK GUIDE) test strip 1 each by Other route 4 (four) times daily as needed for other (to check blood glucose level). Use as instructed 05/02/21   Maximiano Coss, NP  metFORMIN (GLUCOPHAGE) 500 MG tablet Take 1 tablet (500 mg total) by mouth 2 (two) times daily with a meal. 03/22/21   Maximiano Coss, NP  Multiple Vitamin (MULTIVITAMIN WITH MINERALS) TABS tablet Take 1 tablet by mouth daily.     [provider]  pantoprazole (PROTONIX) 40 MG tablet Take 1 tablet (40 mg total) by mouth 2 (two) times daily. 03/12/21   Maximiano Coss, NP  rosuvastatin (CRESTOR) 5 MG tablet Take 1 tablet (5 mg total) by mouth daily. 03/22/21   Maximiano Coss, NP     Allergies    Amoxicillin, Penicillins, and Other    Review of Systems   Review of Systems  Constitutional: Negative.   HENT:  Positive for dental problem and facial swelling. Negative for sinus pressure, sneezing, sore throat, tinnitus, trouble swallowing and voice change.   Respiratory: Negative.    Cardiovascular: Negative.   Gastrointestinal: Negative.   Genitourinary: Negative.   Musculoskeletal: Negative.   Skin: Negative.   Neurological: Negative.   All other systems reviewed and are negative.  Physical Exam Updated Vital Signs BP 130/80 (BP Location: Left Arm)   Pulse 92   Temp 98.1 F (36.7 C) (Oral)   Resp 20   SpO2 91%  Physical Exam Vitals and nursing note reviewed.  Constitutional:      General: She is not in acute distress.    Appearance: She is well-developed. She is not ill-appearing, toxic-appearing or diaphoretic.  HENT:     Head: Atraumatic.     Jaw: There is normal jaw occlusion.      Comments: No submandibular swelling.  Does have some mild left-sided facial swelling adjacent to the left mandible  however does not extend into the submandibular region. No ludwigs angina    Mouth/Throat:     Lips: Pink.     Mouth: Mucous membranes are moist.     Dentition: Abnormal dentition. Dental tenderness and dental caries present.     Pharynx: Oropharynx is clear. Uvula midline.      Comments: Poor dentition, multiple dental caries, missing multiple teeth.  Left lower gingival erythema however no drainable abscess Eyes:     Pupils: Pupils are equal, round, and reactive to light.  Neck:     Trachea: Trachea and phonation normal.     Comments: No neck stiffness or neck rigidity Cardiovascular:     Rate  and Rhythm: Normal rate.  Pulmonary:     Effort: No respiratory distress.  Abdominal:     General: There is no distension.  Musculoskeletal:        General: Normal range of motion.     Cervical back: Full passive range of motion without pain and normal range of motion.  Skin:    General: Skin is warm and dry.  Neurological:     General: No focal deficit present.     Mental Status: She is alert.  Psychiatric:        Mood and Affect: Mood normal.     ED Results / Procedures / Treatments   Labs (all labs ordered are listed, but only abnormal results are displayed) Labs Reviewed - No data to display  EKG None  Radiology No results found.  Procedures Procedures    Medications Ordered in ED Medications - No data to display  ED Course/ Medical Decision Making/ A&P    51 year old history of diabetes, severe periodontal disease here for evaluation of dental pain which began yesterday.  Noted some left-sided facial swelling earlier today.  She has no systemic symptoms.  Speaks without difficulty.  She does have significant dental caries, multiple missing teeth.  Some tenderness to left lower dentition with some overlying gingival erythema however no drainable abscess.  Has some left sided facial swelling however does not extend into submandibular region.  Low suspicion for Ludwig's angina.  Her sublingual area is soft.  Low suspicion for deep space infection.  Has multiple medication allergies.  Will Rx for abx and have FU with dentistry. Will return for new or worsening symptoms.  The patient has been appropriately medically screened and/or stabilized in the ED. I have low suspicion for any other emergent medical condition which would require further screening, evaluation or treatment in the ED or require inpatient management.  Patient is hemodynamically stable and in no acute distress.  Patient able to ambulate in department prior to ED.  Evaluation does not show acute pathology  that would require ongoing or additional emergent interventions while in the emergency department or further inpatient treatment.  I have discussed the diagnosis with the patient and answered all questions.  Pain is been managed while in the emergency department and patient has no further complaints prior to discharge.  Patient is comfortable with plan discussed in room and is stable for discharge at this time.  I have discussed strict return precautions for returning to the emergency department.  Patient was encouraged to follow-up with PCP/specialist refer to at discharge.                            Medical Decision Making Amount and/or Complexity of Data Reviewed External Data Reviewed: notes.  Risk  OTC drugs. Prescription drug management. Diagnosis or treatment significantly limited by social determinants of health.           Final Clinical Impression(s) / ED Diagnoses Final diagnoses:  Dental abscess    Rx / DC Orders ED Discharge Orders          Ordered    clindamycin (CLEOCIN) 300 MG capsule  3 times daily        07/19/21 1749              Alexsys Eskin A, PA-C 07/19/21 1815    Malvin Johns, MD 07/19/21 1828

## 2021-07-19 NOTE — ED Triage Notes (Signed)
Left lower dental pain x 3 days. Swelling started this morning. Concerned for abscess. Here yesterday for same, but LWBS due to wait times. Denies pain, endorses pressure.

## 2021-07-21 DIAGNOSIS — M272 Inflammatory conditions of jaws: Secondary | ICD-10-CM | POA: Diagnosis not present

## 2021-07-21 DIAGNOSIS — K029 Dental caries, unspecified: Secondary | ICD-10-CM | POA: Diagnosis not present

## 2021-07-21 DIAGNOSIS — R220 Localized swelling, mass and lump, head: Secondary | ICD-10-CM | POA: Diagnosis not present

## 2021-07-21 DIAGNOSIS — J341 Cyst and mucocele of nose and nasal sinus: Secondary | ICD-10-CM | POA: Diagnosis not present

## 2021-07-21 DIAGNOSIS — K047 Periapical abscess without sinus: Secondary | ICD-10-CM | POA: Diagnosis not present

## 2021-08-08 ENCOUNTER — Other Ambulatory Visit: Payer: Self-pay

## 2021-08-08 ENCOUNTER — Encounter: Payer: Self-pay | Admitting: Registered Nurse

## 2021-08-08 ENCOUNTER — Ambulatory Visit (INDEPENDENT_AMBULATORY_CARE_PROVIDER_SITE_OTHER): Payer: BC Managed Care – PPO | Admitting: Registered Nurse

## 2021-08-08 VITALS — BP 126/78 | HR 76 | Temp 98.3°F | Resp 18 | Ht 62.0 in | Wt 185.0 lb

## 2021-08-08 DIAGNOSIS — S8251XD Displaced fracture of medial malleolus of right tibia, subsequent encounter for closed fracture with routine healing: Secondary | ICD-10-CM

## 2021-08-08 MED ORDER — TRAMADOL HCL 50 MG PO TABS: 50.0000 mg | ORAL_TABLET | Freq: Three times a day (TID) | ORAL | 0 refills | Status: DC | PRN

## 2021-08-08 NOTE — Patient Instructions (Addendum)
Ms. Maria Bailey to see you, sorry you're going through this  We will get you seen by an ortho doc as soon as we can.  Tramadol for pain as needed in interim  Thank you,  Rich    If you have lab work done today you will be contacted with your lab results within the next 2 weeks.  If you have not heard from [place] then please contact us. The fastest way to get your results is to register for My Chart.   IF you received an x-ray today, you will receive an invoice from [facility]. Please contact [facility] at [phone] with questions or concerns regarding your invoice.   IF you received labwork today, you will receive an invoice from [facility]. Please contact [facility] at [phone] with questions or concerns regarding your invoice.   Our billing staff will not be able to assist you with questions regarding bills from these companies.  You will be contacted with the lab results as soon as they are available. The fastest way to get your results is to activate your My Chart account. Instructions are located on the last page of this paperwork. If you have not heard from [place] regarding the results in 2 weeks, please contact this office.

## 2021-08-08 NOTE — Progress Notes (Signed)
Acute Office Visit  Subjective:    Patient ID: Maria Bailey, female    DOB: April 29, 1970, 51 y.o.   MRN: [id number]  Chief Complaint  Patient presents with   Referral    Patient states she is here for a referral to Ortho for having a broken ankle     HPI Patient is in today for ER follow up   Presented to [facility] in [place] with subacute ankle pain on 08/03/21 Found to have "old vs. New small chip / avulsion fracture of the inferior right medial malleolus". Foot xray unremarkable  She works a very physical job, on her feet 10+ hours daily. Heavy lifting. Denies acute injury or trauma  Here today in boot. Pain is manageable with boot.   She works a very physical job and will not be able to work until this is significantly improved or resolved. Given the physical nature of her work, it is likely that this is work-related She plans to pursue FMLA while healing.    Outpatient Medications Prior to Visit  Medication Sig Dispense Refill   Accu-Chek Softclix Lancets lancets Use up to three times daily at distal end of finger to check capillary blood glucose. 100 each 12   blood glucose meter kit and supplies KIT Dispense based on patient and insurance preference. Use up to four times daily as directed. 1 each 0   cefdinir (OMNICEF) 300 MG capsule Take 1 capsule (300 mg total) by mouth 2 (two) times daily. 20 capsule 0   glucose blood (ACCU-CHEK GUIDE) test strip 1 each by Other route 4 (four) times daily as needed for other (to check blood glucose level). Use as instructed 400 each 12   metFORMIN (GLUCOPHAGE) 500 MG tablet Take 1 tablet (500 mg total) by mouth 2 (two) times daily with a meal. 180 tablet 3   Multiple Vitamin (MULTIVITAMIN WITH MINERALS) TABS tablet Take 1 tablet by mouth daily.     pantoprazole (PROTONIX) 40 MG tablet Take 1 tablet (40 mg total) by mouth 2 (two) times daily. 30 tablet 0   rosuvastatin (CRESTOR) 5 MG tablet Take 1 tablet (5 mg  total) by mouth daily. 90 tablet 3   No facility-administered medications prior to visit.    Review of Systems  Constitutional: Negative.   HENT: Negative.    Eyes: Negative.   Respiratory: Negative.    Cardiovascular: Negative.   Gastrointestinal: Negative.   Genitourinary: Negative.   Musculoskeletal:  Positive for arthralgias and joint swelling. Negative for back pain, gait problem, myalgias, neck pain and neck stiffness.  Skin: Negative.   Neurological: Negative.   Psychiatric/Behavioral: Negative.    All other systems reviewed and are negative.      Objective:    BP 126/78   Pulse 76   Temp 98.3 F (36.8 C) (Temporal)   Resp 18   Ht _0  (1.575 m)   Wt 185 lb (83.9 kg)   SpO2 97%   BMI 33.84 kg/m  Physical Exam Vitals and nursing note reviewed.  Constitutional:      General: She is not in acute distress.    Appearance: Normal appearance. She is normal weight. She is not ill-appearing, toxic-appearing or diaphoretic.  Cardiovascular:     Rate and Rhythm: Normal rate and regular rhythm.     Heart sounds: Normal heart sounds. No murmur heard.    No friction rub. No gallop.  Pulmonary:     Effort: Pulmonary effort is normal. No  respiratory distress.     Breath sounds: Normal breath sounds. No stridor. No wheezing, rhonchi or rales.  Chest:     Chest wall: No tenderness.  Musculoskeletal:        General: Swelling, tenderness and signs of injury present.  Skin:    General: Skin is warm and dry.  Neurological:     General: No focal deficit present.     Mental Status: She is alert and oriented to person, place, and time. Mental status is at baseline.  Psychiatric:        Mood and Affect: Mood normal.        Behavior: Behavior normal.        Thought Content: Thought content normal.        Judgment: Judgment normal.     No results found for any visits on 08/08/21.      Assessment & Plan:  1. Closed avulsion fracture of medial malleolus of right tibia  with routine healing, subsequent encounter - Ambulatory referral to Orthopedic Surgery - traMADol (ULTRAM) 50 MG tablet; Take 1 tablet (50 mg total) by mouth every 8 (eight) hours as needed for up to 5 days.  Dispense: 10 tablet; Refill: 0    Meds ordered this encounter  Medications   traMADol (ULTRAM) 50 MG tablet    Sig: Take 1 tablet (50 mg total) by mouth every 8 (eight) hours as needed for up to 5 days.    Dispense:  10 tablet    Refill:  0    Order Specific Question:   Supervising Provider    Answer:   Carlota Raspberry, JEFFREY R [[id number]]    Return if symptoms worsen or fail to improve.  PLAN Refer to ortho surgery  Tramadol for pain. PDMP reviewed. Risks discussed Patient encouraged to call clinic with any questions, comments, or concerns.  Maximiano Coss, NP

## 2021-08-09 ENCOUNTER — Encounter: Payer: Self-pay | Admitting: Surgery

## 2021-08-09 ENCOUNTER — Ambulatory Visit: Payer: Self-pay

## 2021-08-09 ENCOUNTER — Ambulatory Visit (INDEPENDENT_AMBULATORY_CARE_PROVIDER_SITE_OTHER): Payer: BC Managed Care – PPO | Admitting: Surgery

## 2021-08-09 DIAGNOSIS — M25571 Pain in right ankle and joints of right foot: Secondary | ICD-10-CM

## 2021-08-09 DIAGNOSIS — M722 Plantar fascial fibromatosis: Secondary | ICD-10-CM

## 2021-08-09 DIAGNOSIS — S93421A Sprain of deltoid ligament of right ankle, initial encounter: Secondary | ICD-10-CM | POA: Diagnosis not present

## 2021-08-09 NOTE — Progress Notes (Signed)
Office Visit Note   Patient: Maria Bailey           Date of Birth: 1970-11-09           MRN: [id number] Visit Date: 08/09/2021              Requested by: Janeece Agee, NP [address] [place],  [place] [address] PCP: Janeece Agee, NP   Assessment & Plan: Visit Diagnoses:  1. Pain in right ankle and joints of right foot   2. Sprain of deltoid ligament of right ankle, initial encounter   3. Plantar fasciitis of right foot     Plan: At this point I advised patient to continue the cam boot and I recommend weightbearing as tolerated but she must use crutches when doing so.  She also stated to me that she will not use the crutches because she "can't".  Advised patient that noncompliance with instructions can delay healing.  She voices understanding.  States that she would use a knee scooter and prescription sent in for this.  Note given keeping her out of work for 2 to 3 weeks.  We will follow-up with Dr. Ophelia Charter in 2 weeks for recheck.  Advised patient that I am not quite sure as to how the avulsion fracture/ankle sprain occurred.  She did not have the typical mechanism of injury that is normally seen with this.  I will get Dr. Marlene Bast input.  will see if he feels that patient needs further imaging with MRI.  Follow-Up Instructions: Return in about 2 weeks (around 08/23/2021) for with dr yates recheck right ankle pain.   Orders:  Orders Placed This Encounter  Procedures   XR Ankle Complete Right   No orders of the defined types were placed in this encounter.     Procedures: No procedures performed   Clinical Data: No additional findings.   Subjective: Chief Complaint  Patient presents with   Right Ankle - Pain    HPI 51 year old white female is referred by her primary care provider to our office for right ankle pain and avulsion fracture.  Patient works for [employer] in [employer] and reports that she was walking to the time clock when she had  sudden onset left ankle pain.  She states that she did not have any specific injury at work or before reporting to work and was only walking to the time clock.  She states that she did not report ankle pain to her job.  Patient went out of town to visit her daughter in [place] and states that her ankle pain worsened.  Again no injury.  Patient was seen at [facility] emergency department August 03, 2021 and had x-rays and that report showed:  XR Ankle Right 3 View +  Final Result  IMPRESSION:  Old versus new small chip / avulsion fracture of the inferior right medial  malleolus.   I reviewed that ED visit and provider had mentioned putting patient in a posterior splint and given crutches with nonweightbearing restrictions but patient declined the use of crutches and splint and she was put in a boot.  Patient has been weightbearing as tolerated in the boot with pain.  She has been out of work.  She was seen by her primary care provider yesterday who referred patient to our office.  Patient also reports a problem with chronic right plantar fasciitis.  Patient also states that she has now reported this to her job for  a Worker's Comp. claim.     Review of Systems No current cardiopulmonary GI/GU issues  Objective: Vital Signs: There were no vitals taken for this visit.  Physical Exam HENT:     Head: Normocephalic and atraumatic.     Nose: Nose normal.  Eyes:     Extraocular Movements: Extraocular movements intact.  Pulmonary:     Effort: No respiratory distress.  Musculoskeletal:     Comments: Gait is antalgic.  Exam right ankle she has mild swelling medially.  No bruising.  Neurovascular intact.  She has moderate to marked tenderness at the inferior aspect of the medial malleolus.  Achilles tendon unremarkable.  Nontender across the anterior tibiotalar joint line.  Moderate to marked tenderness at the plantar fascia calcaneal insertion.  I did not assess ankle ligament stability due to  pain.  Neurological:     Mental Status: She is alert.  Psychiatric:        Mood and Affect: Mood normal.     Ortho Exam  Specialty Comments:  No specialty comments available.  Imaging: No results found.   PMFS History: Patient Active Problem List   Diagnosis Date Noted   Concussion 07/23/2018   Right leg pain    Back pain    Dysesthesia    Past Medical History:  Diagnosis Date   Back pain    Concussion    Dysesthesia    Face pain    GERD (gastroesophageal reflux disease)    History of stomach ulcers    Neck pain    Rheumatoid arthritis (HCC)    Right arm pain    Right leg pain     Family History  Problem Relation Age of Onset   Diabetes Father    Cancer Maternal Grandmother    Cancer Maternal Grandfather    Fibromyalgia Sister     Past Surgical History:  Procedure Laterality Date   c sections     x2   COLONOSCOPY     Social History   Occupational History   Not on file  Tobacco Use   Smoking status: Every Day    Packs/day: 1.00    Types: Cigarettes   Smokeless tobacco: Never  Vaping Use   Vaping Use: Never used  Substance and Sexual Activity   Alcohol use: Yes    Comment: occ   Drug use: No   Sexual activity: Not on file

## 2021-08-12 ENCOUNTER — Other Ambulatory Visit: Payer: Self-pay | Admitting: Registered Nurse

## 2021-08-12 ENCOUNTER — Encounter: Payer: Self-pay | Admitting: Registered Nurse

## 2021-08-12 ENCOUNTER — Telehealth: Payer: Self-pay | Admitting: Registered Nurse

## 2021-08-12 NOTE — Telephone Encounter (Signed)
Would advise OTC analgesics unless ortho has something else in mind  Thanks,  Rich

## 2021-08-12 NOTE — Telephone Encounter (Signed)
Pt called stating that Richard prescribed her tramadol 50 mg.  Pt had a allegoric reaction to the medication. PT didn't know she was allergic to the tramadol.  Pt want to know if she can take something else for her issues.

## 2021-08-20 ENCOUNTER — Telehealth: Payer: Self-pay | Admitting: Orthopaedic Surgery

## 2021-08-20 NOTE — Telephone Encounter (Signed)
I received $25.00 cash,medical records release form and disability paperwork from patient/Forwarding to [facility] today

## 2021-08-27 ENCOUNTER — Ambulatory Visit: Payer: BC Managed Care – PPO | Admitting: Orthopaedic Surgery

## 2021-09-03 ENCOUNTER — Ambulatory Visit (INDEPENDENT_AMBULATORY_CARE_PROVIDER_SITE_OTHER): Payer: BC Managed Care – PPO | Admitting: Orthopaedic Surgery

## 2021-09-03 ENCOUNTER — Encounter: Payer: Self-pay | Admitting: Orthopaedic Surgery

## 2021-09-03 DIAGNOSIS — M25571 Pain in right ankle and joints of right foot: Secondary | ICD-10-CM

## 2021-09-03 NOTE — Progress Notes (Signed)
Office Visit Note   Patient: Maria Bailey           Date of Birth: [birth year]           MRN: [id number] Visit Date: 09/03/2021              Requested by: Janeece Agee, NP [address] [place],  [place] [address] PCP: Janeece Agee, NP   Assessment & Plan: Visit Diagnoses: No diagnosis found.  Plan: Work note given for work resumption tomorrow no restrictions she can return if she has ongoing or recurrent problems with plantar fasciitis.  Follow-Up Instructions: No follow-ups on file.   Orders:  No orders of the defined types were placed in this encounter.  No orders of the defined types were placed in this encounter.     Procedures: No procedures performed   Clinical Data: No additional findings.   Subjective: Chief Complaint  Patient presents with   Right Ankle - Pain, Follow-up    HPI 51 year old female returns with improvement in her ankle pain symptoms.  States she is walking better she had used a knee roller and using small ankle slip on sleep currently.  She is no longer limping and is requesting a work note so she can return to work tomorrow without restrictions.  She has had past problems with chronic planter fasciitis in the past.  Review of Systems all the systems noncontributory.   Objective: Vital Signs: BP 126/74   Pulse 87   Ht 5\' 2"  (1.575 m)   Wt 185 lb (83.9 kg)   BMI 33.84 kg/m   Physical Exam Constitutional:      Appearance: She is well-developed.  HENT:     Head: Normocephalic.     Right Ear: External ear normal.     Left Ear: External ear normal. There is no impacted cerumen.  Eyes:     Pupils: Pupils are equal, round, and reactive to light.  Neck:     Thyroid: No thyromegaly.     Trachea: No tracheal deviation.  Cardiovascular:     Rate and Rhythm: Normal rate.  Pulmonary:     Effort: Pulmonary effort is normal.  Abdominal:     Palpations: Abdomen is soft.  Musculoskeletal:     Cervical back: No rigidity.   Skin:    General: Skin is warm and dry.  Neurological:     Mental Status: She is alert and oriented to person, place, and time.  Psychiatric:        Behavior: Behavior normal.     Ortho Exam normal heel-toe gait in a regular shoe with ankle brace.  Normal stride length no limping.  Specialty Comments:  No specialty comments available.  Imaging: No results found.   PMFS History: Patient Active Problem List   Diagnosis Date Noted   Concussion 07/23/2018   Right leg pain    Back pain    Dysesthesia    Past Medical History:  Diagnosis Date   Back pain    Concussion    Dysesthesia    Face pain    GERD (gastroesophageal reflux disease)    History of stomach ulcers    Neck pain    Rheumatoid arthritis (HCC)    Right arm pain    Right leg pain     Family History  Problem Relation Age of Onset   Diabetes Father    Cancer Maternal Grandmother    Cancer Maternal Grandfather    Fibromyalgia Sister  Past Surgical History:  Procedure Laterality Date   c sections     x2   COLONOSCOPY     Social History   Occupational History   Not on file  Tobacco Use   Smoking status: Every Day    Packs/day: 1.00    Types: Cigarettes   Smokeless tobacco: Never  Vaping Use   Vaping Use: Never used  Substance and Sexual Activity   Alcohol use: Yes    Comment: occ   Drug use: No   Sexual activity: Not on file

## 2021-09-09 ENCOUNTER — Encounter: Payer: Self-pay | Admitting: Family Medicine

## 2021-09-09 ENCOUNTER — Ambulatory Visit (INDEPENDENT_AMBULATORY_CARE_PROVIDER_SITE_OTHER): Payer: BC Managed Care – PPO | Admitting: Family Medicine

## 2021-09-09 VITALS — BP 120/80 | HR 80 | Temp 97.9°F | Resp 20 | Ht 62.0 in | Wt 185.8 lb

## 2021-09-09 DIAGNOSIS — Z7984 Long term (current) use of oral hypoglycemic drugs: Secondary | ICD-10-CM | POA: Diagnosis not present

## 2021-09-09 DIAGNOSIS — R400 Somnolence: Secondary | ICD-10-CM | POA: Diagnosis not present

## 2021-09-09 DIAGNOSIS — E785 Hyperlipidemia, unspecified: Secondary | ICD-10-CM | POA: Diagnosis not present

## 2021-09-09 DIAGNOSIS — E119 Type 2 diabetes mellitus without complications: Secondary | ICD-10-CM

## 2021-09-09 DIAGNOSIS — E1169 Type 2 diabetes mellitus with other specified complication: Secondary | ICD-10-CM | POA: Diagnosis not present

## 2021-09-09 DIAGNOSIS — Z23 Encounter for immunization: Secondary | ICD-10-CM

## 2021-09-09 MED ORDER — ROSUVASTATIN CALCIUM 5 MG PO TABS: 5.0000 mg | ORAL_TABLET | Freq: Every day | ORAL | 3 refills | Status: DC

## 2021-09-09 MED ORDER — METFORMIN HCL 500 MG PO TABS: 500.0000 mg | ORAL_TABLET | Freq: Two times a day (BID) | ORAL | 3 refills | Status: DC

## 2021-09-09 NOTE — Progress Notes (Signed)
Subjective:  Patient ID: Maria Bailey, female    DOB: 31-May-1970  Age: 51 y.o. MRN: [id number]  CC:  Chief Complaint  Patient presents with   Diabetes   Hyperlipidemia    HPI [name] presents for   Follow-up of chronic issues, previous PCP Maximiano Coss, NP.  Diabetes: Complications, treated with metformin.  Note reviewed from March 8, sluggish, shaky, clammy at times with fatigue.  Home blood sugar monitoring recommended, concern for hypoglycemia, metformin was discontinued. Monitored blood sugars for a month - 88-141 off metformin.  Would take 1 metformin when over 140 - every few weeks.   Home readings fasting: 96 Postprandial: 112 Symptomatic lows: none.  No further sleepy episodes at work off metformin. Snoring at night. No prior testing    Microalbumin: Due Optho, foot exam, pneumovax: optho visit 2 weeks ago.  Pneumonia vaccine declined. 2nd shingrix today.  Immunization History  Administered Date(s) Administered   Zoster Recombinat (Shingrix) 04/10/2021    Wt Readings from Last 3 Encounters:  09/09/21 185 lb 12.8 oz (84.3 kg)  09/03/21 185 lb (83.9 kg)  08/08/21 185 lb (83.9 kg)     Lab Results  Component Value Date   HGBA1C 6.7 (H) 03/22/2021   Lab Results  Component Value Date   LDLCALC 106 (H) 03/22/2021   CREATININE 0.95 03/22/2021   Hyperlipidemia: Crestor 5 mg daily. No new side effects. Taking daily.  Lab Results  Component Value Date   CHOL 170 03/22/2021   HDL 39.30 03/22/2021   LDLCALC 106 (H) 03/22/2021   TRIG 119.0 03/22/2021   CHOLHDL 4 03/22/2021   Lab Results  Component Value Date   ALT 9 03/22/2021   AST 11 03/22/2021   ALKPHOS 60 03/22/2021   BILITOT 0.4 03/22/2021      History Patient Active Problem List   Diagnosis Date Noted   Pain in right ankle and joints of right foot 09/03/2021   Concussion 07/23/2018   Right leg pain    Back pain    Dysesthesia    Past Medical History:  Diagnosis Date   Back  pain    Concussion    Dysesthesia    Face pain    GERD (gastroesophageal reflux disease)    History of stomach ulcers    Neck pain    Rheumatoid arthritis (HCC)    Right arm pain    Right leg pain    Past Surgical History:  Procedure Laterality Date   c sections     x2   COLONOSCOPY     Allergies  Allergen Reactions   Amoxicillin Anaphylaxis and Hives   Penicillins Anaphylaxis and Hives    Has patient had a PCN reaction causing immediate rash, facial/tongue/throat swelling, SOB or lightheadedness with hypotension: Yes Has patient had a PCN reaction causing severe rash involving mucus membranes or skin necrosis: No Has patient had a PCN reaction that required hospitalization 10 hr hospital visit Has patient had a PCN reaction occurring within the last 10 years: No If all of the above answers are "NO", then may proceed with Cephalosporin use.   Prednisone Anaphylaxis   Tramadol    Other Rash and Other (See Comments)    Steroids cause palpitations   Prior to Admission medications   Medication Sig Start Date End Date Taking? Authorizing Provider  Accu-Chek Softclix Lancets lancets Use up to three times daily at distal end of finger to check capillary blood glucose. 04/26/21  Yes Maximiano Coss, NP  blood glucose meter kit and supplies KIT Dispense based on patient and insurance preference. Use up to four times daily as directed. 04/10/21  Yes Maximiano Coss, NP  glucose blood (ACCU-CHEK GUIDE) test strip 1 each by Other route 4 (four) times daily as needed for other (to check blood glucose level). Use as instructed 05/02/21  Yes Maximiano Coss, NP  metFORMIN (GLUCOPHAGE) 500 MG tablet Take 1 tablet (500 mg total) by mouth 2 (two) times daily with a meal. 03/22/21  Yes Maximiano Coss, NP  Multiple Vitamin (MULTIVITAMIN WITH MINERALS) TABS tablet Take 1 tablet by mouth daily.   Yes [provider]  cefdinir (OMNICEF) 300 MG capsule Take 1 capsule (300 mg total) by mouth 2  (two) times daily. Patient not taking: Reported on 09/09/2021 04/04/20   Horton, Barbette Hair, MD  pantoprazole (PROTONIX) 40 MG tablet Take 1 tablet (40 mg total) by mouth 2 (two) times daily. Patient not taking: Reported on 09/09/2021 03/12/21   Maximiano Coss, NP  rosuvastatin (CRESTOR) 5 MG tablet Take 1 tablet (5 mg total) by mouth daily. Patient not taking: Reported on 09/09/2021 03/22/21   Maximiano Coss, NP   Social History   Socioeconomic History   Marital status: Divorced    Spouse name: Not on file   Number of children: Not on file   Years of education: Not on file   Highest education level: Not on file  Occupational History   Not on file  Tobacco Use   Smoking status: Every Day    Packs/day: 1.00    Types: Cigarettes   Smokeless tobacco: Never  Vaping Use   Vaping Use: Never used  Substance and Sexual Activity   Alcohol use: Yes    Comment: occ   Drug use: No   Sexual activity: Not on file  Other Topics Concern   Not on file  Social History Narrative   Not on file   Social Determinants of Health   Financial Resource Strain: Not on file  Food Insecurity: Not on file  Transportation Needs: Not on file  Physical Activity: Not on file  Stress: Not on file  Social Connections: Not on file  Intimate Partner Violence: Not on file    Review of Systems  Constitutional:  Negative for fatigue and unexpected weight change.  Respiratory:  Negative for chest tightness and shortness of breath.   Cardiovascular:  Negative for chest pain, palpitations and leg swelling.  Gastrointestinal:  Negative for abdominal pain and blood in stool.  Neurological:  Negative for dizziness, syncope, light-headedness and headaches.     Objective:   Vitals:   09/09/21 1516  BP: 120/80  Pulse: 80  Resp: 20  Temp: 97.9 F (36.6 C)  SpO2: 90%  Weight: 185 lb 12.8 oz (84.3 kg)  Height: $Remove'5\' 2"'NCkQbog$  (1.575 m)     Physical Exam Vitals reviewed.  Constitutional:      Appearance: Normal  appearance. She is well-developed.  HENT:     Head: Normocephalic and atraumatic.  Eyes:     Conjunctiva/sclera: Conjunctivae normal.     Pupils: Pupils are equal, round, and reactive to light.  Neck:     Vascular: No carotid bruit.  Cardiovascular:     Rate and Rhythm: Normal rate and regular rhythm.     Heart sounds: Normal heart sounds.  Pulmonary:     Effort: Pulmonary effort is normal.     Breath sounds: Normal breath sounds.  Abdominal:     Palpations: Abdomen is soft.  There is no pulsatile mass.     Tenderness: There is no abdominal tenderness.  Musculoskeletal:     Right lower leg: No edema.     Left lower leg: No edema.  Skin:    General: Skin is warm and dry.  Neurological:     Mental Status: She is alert and oriented to person, place, and time.  Psychiatric:        Mood and Affect: Mood normal.        Behavior: Behavior normal.        Assessment & Plan:  KELISE KUCH is a 51 y.o. female . Daytime sleepiness  -New concern to provider.  Symptoms have improved with stopping metformin, differential includes prior hypoglycemia but also with history of snoring concern for possible underlying obstructive sleep apnea.  Recommend sleep specialist evaluation/testing if any recurrence of daytime somnolence.  She will let me know.  Type 2 diabetes mellitus without complication, without long-term current use of insulin (HCC) - Plan: rosuvastatin (CRESTOR) 5 MG tablet, metFORMIN (GLUCOPHAGE) 500 MG tablet, Microalbumin / creatinine urine ratio, Hemoglobin A1c, Comprehensive metabolic panel  -Check labs off metformin, diet/exercise approach for control at this time.  Recent Optho visit, check urine microalbumin.  Hyperlipidemia associated with type 2 diabetes mellitus ([place]) - Plan: rosuvastatin (CRESTOR) 5 MG tablet, metFORMIN (GLUCOPHAGE) 500 MG tablet, Lipid panel  -Tolerating current dose of Crestor, continue same, updated labs ordered. Meds ordered this encounter   Medications   rosuvastatin (CRESTOR) 5 MG tablet    Sig: Take 1 tablet (5 mg total) by mouth daily.    Dispense:  90 tablet    Refill:  3   metFORMIN (GLUCOPHAGE) 500 MG tablet    Sig: Take 1 tablet (500 mg total) by mouth 2 (two) times daily with a meal.    Dispense:  180 tablet    Refill:  3   Patient Instructions  Glad to hear you are feeling better off metformin. If you do have repeat sleepy episodes during the day, I do recommend meeting with sleep specialist to screen for sleep apnea.   Continue crestor daily.  Thanks for coming in today.    Type 2 Diabetes Mellitus, Self-Care, Adult Caring for yourself after you have been diagnosed with type 2 diabetes (type 2 diabetes mellitus) means keeping your blood sugar (glucose) under control with a balance of: Nutrition. Exercise. Lifestyle changes. Medicines or insulin, if needed. Support from your team of health care providers and others. What are the risks? Having type 2 diabetes can put you at risk for other long-term (chronic) conditions, such as heart disease and kidney disease. Your health care provider may prescribe medicines to help prevent complications from diabetes. How to monitor your blood glucose  Check your blood glucose every day or as often as told by your health care provider. Have your A1C (hemoglobin A1C) level checked two or more times a year, or as often as told by your health care provider. Your health care provider will set personalized treatment goals for you. Generally, the goal of treatment is to maintain the following blood glucose levels: Before meals: 80-130 mg/dL (4.4-7.2 mmol/L). After meals: below 180 mg/dL (10 mmol/L). A1C level: less than 7%. How to manage hyperglycemia and hypoglycemia Hyperglycemia symptoms Hyperglycemia, also called high blood glucose, occurs when blood glucose is too high. Make sure you know the early signs of hyperglycemia, such as: Increased thirst. Hunger. Feeling very  tired. Needing to urinate more often than usual. Blurry  vision. Hypoglycemia symptoms Hypoglycemia, also called low blood glucose, occurs with a blood glucose level at or below 70 mg/dL (3.9 mmol/L). Diabetes medicines lower your blood glucose and can cause hypoglycemia. The risk for hypoglycemia increases during or after exercise, during sleep, during illness, and when skipping meals or not eating for a long time (fasting). It is important to know the symptoms of hypoglycemia and treat it right away. Always have a 15-gram rapid-acting carbohydrate snack with you to treat low blood glucose. Family members and close friends should also know the symptoms and understand how to treat hypoglycemia, in case you are not able to treat yourself. Symptoms may include: Hunger. Anxiety. Sweating and feeling clammy. Dizziness or feeling light-headed. Sleepiness. Increased heart rate. Irritability. Tingling or numbness around the mouth, lips, or tongue. Restless sleep. Severe hypoglycemia is when your blood glucose level is at or below 54 mg/dL (3 mmol/L). Severe hypoglycemia is an emergency. Do not wait to see if the symptoms will go away. Get medical help right away. Call your local emergency services (911 in the [place] Do not drive yourself to the hospital. If you have severe hypoglycemia and you cannot eat or drink, you may need glucagon. A family member or close friend should learn how to check your blood glucose and how to give you glucagon. Ask your health care provider if you need to have an emergency glucagon kit available. Follow these instructions at home: Medicines Take prescribed insulin or diabetes medicines as told by your health care provider. Do not run out of insulin or other diabetes medicines. Plan ahead so you always have these available. If you use insulin, adjust your dosage based on your physical activity and what foods you eat. Your health care provider will tell you how to adjust  your dosage. Take over-the-counter and prescription medicines only as told by your health care provider. Eating and drinking  What you eat and drink affects your blood glucose and your insulin dosage. Making good choices helps to control your diabetes and prevent other health problems. A healthy meal plan includes eating lean proteins, complex carbohydrates, fresh fruits and vegetables, low-fat dairy products, and healthy fats. Make an appointment to see a registered dietitian to help you create an eating plan that is right for you. Make sure that you: Follow instructions from your health care provider about eating or drinking restrictions. Drink enough fluid to keep your urine pale yellow. Keep a record of the carbohydrates that you eat. Do this by reading food labels and learning the standard serving sizes of foods. Follow your sick-day plan whenever you cannot eat or drink as usual. Make this plan in advance with your health care provider.  Activity Stay active. Exercise regularly, as told by your health care provider. This may include: Stretching and doing strength exercises, such as yoga or weight lifting, two or more times a week. Doing 150 minutes or more of moderate-intensity or vigorous-intensity exercise each week. This could be brisk walking, biking, or water aerobics. Spread out your activity over 3 or more days of the week. Do not go more than 2 days in a row without doing some kind of physical activity. When you start a new exercise or activity, work with your health care provider to adjust your insulin, medicines, or food intake as needed. Lifestyle Do not use any products that contain nicotine or tobacco. These products include cigarettes, chewing tobacco, and vaping devices, such as e-cigarettes. If you need help quitting, ask your health  care provider. If you drink alcohol and your health care provider says that it is safe for you: Limit how much you have to: 0-1 drink a day  for women who are not pregnant. 0-2 drinks a day for men. Know how much alcohol is in your drink. In the [place] one drink equals one 12 oz bottle of beer (355 mL), one 5 oz glass of wine (148 mL), or one 1 oz glass of hard liquor (44 mL). Learn to manage stress. If you need help with this, ask your health care provider. Take care of your body  Keep your immunizations up to date. In addition to getting vaccinations as told by your health care provider, it is recommended that you get vaccinated against the following illnesses: The flu (influenza). Get a flu shot every year. Pneumonia. Hepatitis B. Schedule an eye exam soon after your diagnosis, and then one time every year after that. Check your skin and feet every day for cuts, bruises, redness, blisters, or sores. Schedule a foot exam with your health care provider once every year. Brush your teeth and gums two times a day, and floss one or more times a day. Visit your dentist one or more times every 6 months. Maintain a healthy weight. General instructions Share your diabetes management plan with people in your workplace, school, and household. Carry a medical alert card or wear medical alert jewelry. Keep all follow-up visits. This is important. Questions to ask your health care provider Should I meet with a certified diabetes care and education specialist? Where can I find a support group for people with diabetes? Where to find more information For help and guidance and for more information about diabetes, please visit: American Diabetes Association (ADA): www.diabetes.org American Association of Diabetes Care and Education Specialists (ADCES): www.diabeteseducator.org International Diabetes Federation (IDF): MemberVerification.ca Summary Caring for yourself after you have been diagnosed with type 2 diabetes (type 2 diabetes mellitus) means keeping your blood sugar (glucose) under control with a balance of nutrition, exercise, lifestyle changes,  and medicine. Check your blood glucose every day, as often as told by your health care provider. Having diabetes can put you at risk for other long-term (chronic) conditions, such as heart disease and kidney disease. Your health care provider may prescribe medicines to help prevent complications from diabetes. Share your diabetes management plan with people in your workplace, school, and household. Keep all follow-up visits. This is important. This information is not intended to replace advice given to you by your health care provider. Make sure you discuss any questions you have with your health care provider. Document Revised: 06/20/2020 Document Reviewed: 06/20/2020 Elsevier Patient Education  [place],   Merri Ray, MD [place], [facility] 09/09/21 3:58 PM

## 2021-09-09 NOTE — Patient Instructions (Addendum)
Glad to hear you are feeling better off metformin. If you do have repeat sleepy episodes during the day, I do recommend meeting with sleep specialist to screen for sleep apnea.   Continue crestor daily.  Thanks for coming in today.    Type 2 Diabetes Mellitus, Self-Care, Adult Caring for yourself after you have been diagnosed with type 2 diabetes (type 2 diabetes mellitus) means keeping your blood sugar (glucose) under control with a balance of: Nutrition. Exercise. Lifestyle changes. Medicines or insulin, if needed. Support from your team of health care providers and others. What are the risks? Having type 2 diabetes can put you at risk for other long-term (chronic) conditions, such as heart disease and kidney disease. Your health care provider may prescribe medicines to help prevent complications from diabetes. How to monitor your blood glucose  Check your blood glucose every day or as often as told by your health care provider. Have your A1C (hemoglobin A1C) level checked two or more times a year, or as often as told by your health care provider. Your health care provider will set personalized treatment goals for you. Generally, the goal of treatment is to maintain the following blood glucose levels: Before meals: 80-130 mg/dL (4.4-7.2 mmol/L). After meals: below 180 mg/dL (10 mmol/L). A1C level: less than 7%. How to manage hyperglycemia and hypoglycemia Hyperglycemia symptoms Hyperglycemia, also called high blood glucose, occurs when blood glucose is too high. Make sure you know the early signs of hyperglycemia, such as: Increased thirst. Hunger. Feeling very tired. Needing to urinate more often than usual. Blurry vision. Hypoglycemia symptoms Hypoglycemia, also called low blood glucose, occurs with a blood glucose level at or below 70 mg/dL (3.9 mmol/L). Diabetes medicines lower your blood glucose and can cause hypoglycemia. The risk for hypoglycemia increases during or after  exercise, during sleep, during illness, and when skipping meals or not eating for a long time (fasting). It is important to know the symptoms of hypoglycemia and treat it right away. Always have a 15-gram rapid-acting carbohydrate snack with you to treat low blood glucose. Family members and close friends should also know the symptoms and understand how to treat hypoglycemia, in case you are not able to treat yourself. Symptoms may include: Hunger. Anxiety. Sweating and feeling clammy. Dizziness or feeling light-headed. Sleepiness. Increased heart rate. Irritability. Tingling or numbness around the mouth, lips, or tongue. Restless sleep. Severe hypoglycemia is when your blood glucose level is at or below 54 mg/dL (3 mmol/L). Severe hypoglycemia is an emergency. Do not wait to see if the symptoms will go away. Get medical help right away. Call your local emergency services (911 in the [place] Do not drive yourself to the hospital. If you have severe hypoglycemia and you cannot eat or drink, you may need glucagon. A family member or close friend should learn how to check your blood glucose and how to give you glucagon. Ask your health care provider if you need to have an emergency glucagon kit available. Follow these instructions at home: Medicines Take prescribed insulin or diabetes medicines as told by your health care provider. Do not run out of insulin or other diabetes medicines. Plan ahead so you always have these available. If you use insulin, adjust your dosage based on your physical activity and what foods you eat. Your health care provider will tell you how to adjust your dosage. Take over-the-counter and prescription medicines only as told by your health care provider. Eating and drinking  What you eat and  drink affects your blood glucose and your insulin dosage. Making good choices helps to control your diabetes and prevent other health problems. A healthy meal plan includes eating  lean proteins, complex carbohydrates, fresh fruits and vegetables, low-fat dairy products, and healthy fats. Make an appointment to see a registered dietitian to help you create an eating plan that is right for you. Make sure that you: Follow instructions from your health care provider about eating or drinking restrictions. Drink enough fluid to keep your urine pale yellow. Keep a record of the carbohydrates that you eat. Do this by reading food labels and learning the standard serving sizes of foods. Follow your sick-day plan whenever you cannot eat or drink as usual. Make this plan in advance with your health care provider.  Activity Stay active. Exercise regularly, as told by your health care provider. This may include: Stretching and doing strength exercises, such as yoga or weight lifting, two or more times a week. Doing 150 minutes or more of moderate-intensity or vigorous-intensity exercise each week. This could be brisk walking, biking, or water aerobics. Spread out your activity over 3 or more days of the week. Do not go more than 2 days in a row without doing some kind of physical activity. When you start a new exercise or activity, work with your health care provider to adjust your insulin, medicines, or food intake as needed. Lifestyle Do not use any products that contain nicotine or tobacco. These products include cigarettes, chewing tobacco, and vaping devices, such as e-cigarettes. If you need help quitting, ask your health care provider. If you drink alcohol and your health care provider says that it is safe for you: Limit how much you have to: 0-1 drink a day for women who are not pregnant. 0-2 drinks a day for men. Know how much alcohol is in your drink. In the [place] one drink equals one 12 oz bottle of beer (355 mL), one 5 oz glass of wine (148 mL), or one 1 oz glass of hard liquor (44 mL). Learn to manage stress. If you need help with this, ask your health care  provider. Take care of your body  Keep your immunizations up to date. In addition to getting vaccinations as told by your health care provider, it is recommended that you get vaccinated against the following illnesses: The flu (influenza). Get a flu shot every year. Pneumonia. Hepatitis B. Schedule an eye exam soon after your diagnosis, and then one time every year after that. Check your skin and feet every day for cuts, bruises, redness, blisters, or sores. Schedule a foot exam with your health care provider once every year. Brush your teeth and gums two times a day, and floss one or more times a day. Visit your dentist one or more times every 6 months. Maintain a healthy weight. General instructions Share your diabetes management plan with people in your workplace, school, and household. Carry a medical alert card or wear medical alert jewelry. Keep all follow-up visits. This is important. Questions to ask your health care provider Should I meet with a certified diabetes care and education specialist? Where can I find a support group for people with diabetes? Where to find more information For help and guidance and for more information about diabetes, please visit: American Diabetes Association (ADA): www.diabetes.org American Association of Diabetes Care and Education Specialists (ADCES): www.diabeteseducator.org International Diabetes Federation (IDF): MemberVerification.ca Summary Caring for yourself after you have been diagnosed with type 2 diabetes (type 2  diabetes mellitus) means keeping your blood sugar (glucose) under control with a balance of nutrition, exercise, lifestyle changes, and medicine. Check your blood glucose every day, as often as told by your health care provider. Having diabetes can put you at risk for other long-term (chronic) conditions, such as heart disease and kidney disease. Your health care provider may prescribe medicines to help prevent complications from  diabetes. Share your diabetes management plan with people in your workplace, school, and household. Keep all follow-up visits. This is important. This information is not intended to replace advice given to you by your health care provider. Make sure you discuss any questions you have with your health care provider. Document Revised: 06/20/2020 Document Reviewed: 06/20/2020 Elsevier Patient Education  [place].

## 2021-09-10 ENCOUNTER — Ambulatory Visit: Payer: BC Managed Care – PPO | Admitting: Registered Nurse

## 2021-09-10 LAB — COMPREHENSIVE METABOLIC PANEL
ALT: 11 U/L (ref 0–35)
AST: 13 U/L (ref 0–37)
Albumin: 4.3 g/dL (ref 3.5–5.2)
Alkaline Phosphatase: 57 U/L (ref 39–117)
BUN: 27 mg/dL — ABNORMAL HIGH (ref 6–23)
CO2: 28 mEq/L (ref 19–32)
Calcium: 9.7 mg/dL (ref 8.4–10.5)
Chloride: 106 mEq/L (ref 96–112)
Creatinine, Ser: 1.29 mg/dL — ABNORMAL HIGH (ref 0.40–1.20)
GFR: 48.4 mL/min — ABNORMAL LOW (ref >60.00)
Glucose, Bld: 88 mg/dL (ref 70–99)
Potassium: 4.4 mEq/L (ref 3.5–5.1)
Sodium: 142 mEq/L (ref 135–145)
Total Bilirubin: 0.3 mg/dL (ref 0.2–1.2)
Total Protein: 7.1 g/dL (ref 6.0–8.3)

## 2021-09-10 LAB — LIPID PANEL
Cholesterol: 181 mg/dL (ref 0–200)
HDL: 42.4 mg/dL (ref >39.00)
LDL Cholesterol: 101 mg/dL — ABNORMAL HIGH (ref 0–99)
NonHDL: 138.34
Total CHOL/HDL Ratio: 4
Triglycerides: 185 mg/dL — ABNORMAL HIGH (ref 0.0–149.0)
VLDL: 37 mg/dL (ref 0.0–40.0)

## 2021-09-10 LAB — MICROALBUMIN / CREATININE URINE RATIO
Creatinine,U: 129.9 mg/dL
Microalb Creat Ratio: 0.7 mg/g (ref 0.0–30.0)
Microalb, Ur: 0.9 mg/dL (ref 0.0–1.9)

## 2021-09-10 LAB — HEMOGLOBIN A1C: Hgb A1c MFr Bld: 6.5 % (ref 4.6–6.5)

## 2021-09-11 ENCOUNTER — Telehealth: Payer: Self-pay | Admitting: Registered Nurse

## 2021-09-11 NOTE — Telephone Encounter (Signed)
Called and spoke with patient and discuss with her that Dr Beverely Low suggested Cold compress to the injection site. Patient was told to follow up if anything gets worse or if she has pain.

## 2021-09-11 NOTE — Telephone Encounter (Signed)
Possible vaccine reaction; vaccine area is red, hot to the touch. Pt not in pain, just concerned.

## 2021-12-08 DIAGNOSIS — M47816 Spondylosis without myelopathy or radiculopathy, lumbar region: Secondary | ICD-10-CM | POA: Diagnosis not present

## 2021-12-08 DIAGNOSIS — R1084 Generalized abdominal pain: Secondary | ICD-10-CM | POA: Diagnosis not present

## 2021-12-08 DIAGNOSIS — R109 Unspecified abdominal pain: Secondary | ICD-10-CM | POA: Diagnosis not present

## 2021-12-08 DIAGNOSIS — M47814 Spondylosis without myelopathy or radiculopathy, thoracic region: Secondary | ICD-10-CM | POA: Diagnosis not present

## 2021-12-08 DIAGNOSIS — Y92410 Unspecified street and highway as the place of occurrence of the external cause: Secondary | ICD-10-CM | POA: Diagnosis not present

## 2021-12-08 DIAGNOSIS — R079 Chest pain, unspecified: Secondary | ICD-10-CM | POA: Diagnosis not present

## 2021-12-08 DIAGNOSIS — M542 Cervicalgia: Secondary | ICD-10-CM | POA: Diagnosis not present

## 2021-12-08 DIAGNOSIS — R519 Headache, unspecified: Secondary | ICD-10-CM | POA: Diagnosis not present

## 2021-12-08 DIAGNOSIS — M26629 Arthralgia of temporomandibular joint, unspecified side: Secondary | ICD-10-CM | POA: Diagnosis not present

## 2021-12-08 DIAGNOSIS — N281 Cyst of kidney, acquired: Secondary | ICD-10-CM | POA: Diagnosis not present

## 2021-12-08 DIAGNOSIS — Y999 Unspecified external cause status: Secondary | ICD-10-CM | POA: Diagnosis not present

## 2021-12-10 ENCOUNTER — Ambulatory Visit (INDEPENDENT_AMBULATORY_CARE_PROVIDER_SITE_OTHER): Payer: BC Managed Care – PPO | Admitting: Family

## 2021-12-10 ENCOUNTER — Encounter: Payer: Self-pay | Admitting: Family

## 2021-12-10 DIAGNOSIS — G44319 Acute post-traumatic headache, not intractable: Secondary | ICD-10-CM

## 2021-12-10 DIAGNOSIS — M545 Low back pain, unspecified: Secondary | ICD-10-CM | POA: Diagnosis not present

## 2021-12-10 MED ORDER — PROMETHAZINE HCL 25 MG/ML IJ SOLN
25.0000 mg | Freq: Once | INTRAMUSCULAR | Status: AC
  Administered 2021-12-10: 25 mg via INTRAMUSCULAR

## 2021-12-10 MED ORDER — KETOROLAC TROMETHAMINE 60 MG/2ML IM SOLN
60.0000 mg | Freq: Once | INTRAMUSCULAR | Status: AC
  Administered 2021-12-10: 60 mg via INTRAMUSCULAR

## 2021-12-10 MED ORDER — OXYCODONE-ACETAMINOPHEN 5-325 MG PO TABS: 1.0000 | ORAL_TABLET | Freq: Three times a day (TID) | ORAL | 0 refills | Status: AC | PRN
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
## 2021-12-11 ENCOUNTER — Ambulatory Visit (INDEPENDENT_AMBULATORY_CARE_PROVIDER_SITE_OTHER): Payer: BC Managed Care – PPO | Admitting: Orthopaedic Surgery

## 2021-12-11 ENCOUNTER — Encounter: Payer: Self-pay | Admitting: Orthopaedic Surgery

## 2021-12-11 VITALS — BP 128/83 | HR 64 | Ht 62.0 in | Wt 185.0 lb

## 2021-12-11 DIAGNOSIS — M546 Pain in thoracic spine: Secondary | ICD-10-CM | POA: Diagnosis not present
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
## 2021-12-16 ENCOUNTER — Telehealth: Payer: Self-pay | Admitting: Orthopaedic Surgery

## 2021-12-16 DIAGNOSIS — M546 Pain in thoracic spine: Secondary | ICD-10-CM

## 2021-12-16 NOTE — Telephone Encounter (Signed)
Referral placed. Per patient-she would like to go to [facility] in [facility] at [address] [facility].
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
## 2021-12-16 NOTE — Telephone Encounter (Signed)
Pt called requesting a referral for physical therapy. Pt phone number is [phone]

## 2021-12-20 ENCOUNTER — Encounter: Payer: Self-pay | Admitting: Family Medicine

## 2021-12-20 ENCOUNTER — Telehealth (INDEPENDENT_AMBULATORY_CARE_PROVIDER_SITE_OTHER): Payer: BC Managed Care – PPO | Admitting: Family Medicine

## 2021-12-20 DIAGNOSIS — R519 Headache, unspecified: Secondary | ICD-10-CM

## 2021-12-20 DIAGNOSIS — H53133 Sudden visual loss, bilateral: Secondary | ICD-10-CM | POA: Diagnosis not present

## 2021-12-20 DIAGNOSIS — R55 Syncope and collapse: Secondary | ICD-10-CM | POA: Diagnosis not present
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
## 2021-12-23 DIAGNOSIS — R42 Dizziness and giddiness: Secondary | ICD-10-CM | POA: Diagnosis not present

## 2021-12-23 DIAGNOSIS — R519 Headache, unspecified: Secondary | ICD-10-CM | POA: Diagnosis not present

## 2021-12-23 DIAGNOSIS — S0993XA Unspecified injury of face, initial encounter: Secondary | ICD-10-CM | POA: Diagnosis not present

## 2021-12-31 ENCOUNTER — Ambulatory Visit (INDEPENDENT_AMBULATORY_CARE_PROVIDER_SITE_OTHER): Payer: BC Managed Care – PPO | Admitting: Family Medicine

## 2021-12-31 ENCOUNTER — Encounter: Payer: Self-pay | Admitting: Family Medicine

## 2021-12-31 VITALS — BP 122/80 | Ht 62.0 in | Wt 185.0 lb

## 2021-12-31 DIAGNOSIS — R490 Dysphonia: Secondary | ICD-10-CM | POA: Insufficient documentation

## 2021-12-31 DIAGNOSIS — S060X9A Concussion with loss of consciousness of unspecified duration, initial encounter: Secondary | ICD-10-CM | POA: Diagnosis not present

## 2021-12-31 DIAGNOSIS — J3800 Paralysis of vocal cords and larynx, unspecified: Secondary | ICD-10-CM | POA: Insufficient documentation
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
## 2021-12-31 NOTE — Patient Instructions (Signed)
Nice to meet you Please perform light exercise  I have made a referral to physical therapy and the ear, nose and throat do doctor  Please send me a message in MyChart with any questions or updates.  Please see me back in 3-4 weeks.   --Dr. Jordan Likes

## 2022-01-02 DIAGNOSIS — K219 Gastro-esophageal reflux disease without esophagitis: Secondary | ICD-10-CM | POA: Diagnosis not present

## 2022-01-02 DIAGNOSIS — R490 Dysphonia: Secondary | ICD-10-CM | POA: Diagnosis not present

## 2022-01-02 DIAGNOSIS — R070 Pain in throat: Secondary | ICD-10-CM | POA: Diagnosis not present

## 2022-01-08 ENCOUNTER — Ambulatory Visit: Payer: BC Managed Care – PPO | Admitting: Physical Therapy

## 2022-01-09 ENCOUNTER — Encounter: Payer: Self-pay | Admitting: Physical Therapy

## 2022-01-09 ENCOUNTER — Ambulatory Visit: Payer: BC Managed Care – PPO | Attending: Family Medicine | Admitting: Physical Therapy

## 2022-01-09 DIAGNOSIS — S060X9A Concussion with loss of consciousness of unspecified duration, initial encounter: Secondary | ICD-10-CM | POA: Diagnosis not present

## 2022-01-09 DIAGNOSIS — M5459 Other low back pain: Secondary | ICD-10-CM | POA: Diagnosis not present

## 2022-01-09 DIAGNOSIS — M6281 Muscle weakness (generalized): Secondary | ICD-10-CM | POA: Diagnosis not present

## 2022-01-09 DIAGNOSIS — M542 Cervicalgia: Secondary | ICD-10-CM | POA: Insufficient documentation

## 2022-01-09 DIAGNOSIS — R42 Dizziness and giddiness: Secondary | ICD-10-CM | POA: Diagnosis not present
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
## 2022-01-14 ENCOUNTER — Ambulatory Visit (INDEPENDENT_AMBULATORY_CARE_PROVIDER_SITE_OTHER): Payer: BC Managed Care – PPO | Admitting: Family Medicine

## 2022-01-14 ENCOUNTER — Encounter: Payer: Self-pay | Admitting: Family Medicine

## 2022-01-14 VITALS — BP 122/70 | Ht 62.0 in | Wt 185.0 lb

## 2022-01-14 DIAGNOSIS — R490 Dysphonia: Secondary | ICD-10-CM | POA: Diagnosis not present

## 2022-01-14 DIAGNOSIS — S060X0D Concussion without loss of consciousness, subsequent encounter: Secondary | ICD-10-CM

## 2022-01-14 NOTE — Assessment & Plan Note (Signed)
Improved with protonix

## 2022-01-14 NOTE — Patient Instructions (Signed)
Good to see you Please continue with physical therapy  Please send me a message in MyChart with any questions or updates.  Please see me back in 4 weeks.   --Dr. Elton Catalano  
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
## 2022-01-15 ENCOUNTER — Encounter: Payer: Self-pay | Admitting: Physical Therapy

## 2022-01-15 ENCOUNTER — Ambulatory Visit: Payer: BC Managed Care – PPO | Admitting: Physical Therapy

## 2022-01-15 DIAGNOSIS — S060X9A Concussion with loss of consciousness of unspecified duration, initial encounter: Secondary | ICD-10-CM | POA: Diagnosis not present

## 2022-01-15 DIAGNOSIS — R42 Dizziness and giddiness: Secondary | ICD-10-CM

## 2022-01-15 DIAGNOSIS — M6281 Muscle weakness (generalized): Secondary | ICD-10-CM | POA: Diagnosis not present

## 2022-01-15 DIAGNOSIS — M542 Cervicalgia: Secondary | ICD-10-CM | POA: Diagnosis not present

## 2022-01-15 DIAGNOSIS — M5459 Other low back pain: Secondary | ICD-10-CM | POA: Diagnosis not present
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
## 2022-01-21 ENCOUNTER — Encounter: Payer: Self-pay | Admitting: Physical Therapy

## 2022-01-21 ENCOUNTER — Ambulatory Visit: Payer: BC Managed Care – PPO | Admitting: Physical Therapy

## 2022-01-21 DIAGNOSIS — M6281 Muscle weakness (generalized): Secondary | ICD-10-CM

## 2022-01-21 DIAGNOSIS — M542 Cervicalgia: Secondary | ICD-10-CM

## 2022-01-21 DIAGNOSIS — M5459 Other low back pain: Secondary | ICD-10-CM

## 2022-01-21 DIAGNOSIS — S060X9A Concussion with loss of consciousness of unspecified duration, initial encounter: Secondary | ICD-10-CM | POA: Diagnosis not present

## 2022-01-21 DIAGNOSIS — R42 Dizziness and giddiness: Secondary | ICD-10-CM
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
## 2022-01-23 ENCOUNTER — Ambulatory Visit: Payer: BC Managed Care – PPO | Admitting: Physical Therapy

## 2022-02-04 ENCOUNTER — Ambulatory Visit: Payer: Self-pay | Attending: Family Medicine

## 2022-02-04 DIAGNOSIS — M542 Cervicalgia: Secondary | ICD-10-CM | POA: Insufficient documentation

## 2022-02-04 DIAGNOSIS — R42 Dizziness and giddiness: Secondary | ICD-10-CM | POA: Insufficient documentation

## 2022-02-04 DIAGNOSIS — M6281 Muscle weakness (generalized): Secondary | ICD-10-CM | POA: Insufficient documentation

## 2022-02-04 DIAGNOSIS — M5459 Other low back pain: Secondary | ICD-10-CM | POA: Insufficient documentation
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
## 2022-02-06 ENCOUNTER — Ambulatory Visit: Payer: Self-pay

## 2022-02-06 DIAGNOSIS — R42 Dizziness and giddiness: Secondary | ICD-10-CM

## 2022-02-06 DIAGNOSIS — M6281 Muscle weakness (generalized): Secondary | ICD-10-CM

## 2022-02-06 DIAGNOSIS — M542 Cervicalgia: Secondary | ICD-10-CM

## 2022-02-06 DIAGNOSIS — M5459 Other low back pain: Secondary | ICD-10-CM

## 2022-02-06 NOTE — Therapy (Signed)
OUTPATIENT PHYSICAL THERAPY TREATMENT    Patient Name: Maria Bailey MRN: [id number] DOB:Jun 25, 1970, 52 y.o., female Today's Date: 02/06/2022  END OF SESSION:     Past Medical History:  Diagnosis Date   Back pain    Concussion    Dysesthesia    Face pain    GERD (gastroesophageal reflux disease)    History of stomach ulcers    Neck pain    Rheumatoid arthritis ([place])    Right arm pain    Right leg pain    Past Surgical History:  Procedure Laterality Date   c sections     x2   COLONOSCOPY     Patient Active Problem List   Diagnosis Date Noted   Dysphonia 12/31/2021   Pain in right ankle and joints of right foot 09/03/2021   Concussion 07/23/2018   Right leg pain    Back pain    Dysesthesia     PCP: Maximiano Coss, NP  REFERRING PROVIDER: Rosemarie Ax, MD  REFERRING DIAG: S06.0X9A (ICD-10-CM) - Concussion with loss of consciousness, initial encounter  THERAPY DIAG:  Dizziness and giddiness  Cervicalgia  Other low back pain  Muscle weakness (generalized)  ONSET DATE: 12/08/2021  Rationale for Evaluation and Treatment: Rehabilitation  SUBJECTIVE:   SUBJECTIVE STATEMENT: Patient reports dealing with personal things at home but she is trying not to stress to increase her pain, she is lightheaded today but has her TENS unit on to help.  Pt accompanied by: self  PERTINENT HISTORY: RA, GERD, smoker, C-section x 2  PAIN:  Are you having pain? Yes: NPRS scale: 8/10 Pain location: R bicep area Pain description: pulling, aching, throbbing Aggravating factors: constant Relieving factors: sleeping  Are you having pain? Yes: NPRS scale: 0/10 Pain location: low back Pain description: aching, burning, wringing Aggravating factors: cold weather, strenous working, bending Relieving factors: resting   PRECAUTIONS: None  WEIGHT BEARING RESTRICTIONS: No  FALLS: Has patient fallen in last 6 months?  No, but had to catch herself several times "feel like  I'm drunk at times"  LIVING ENVIRONMENT: Lives with: lives with their family Lives in: House/apartment Stairs: Yes: External: 3 steps; rail but doesn't trust it Has following equipment at home: None  PLOF: Independent  OCCUPATION:  [employer], inventory control, planning on moving soon to [place]: be able to manage daily life with less pain.   OBJECTIVE:   DIAGNOSTIC FINDINGS: CT head and cervical spine 12/08/21 Disc levels: Mild multilevel degenerative changes without  significant change. More pronounced facet degenerative changes at  multiple levels without significant change.  IMPRESSION:  1. No skull fracture or intracranial hemorrhage.  2. No cervical spine fracture or subluxation.  3. Stable cervical spine degenerative changes.  CT thoracic and lumbar spine 12/08/2021 IMPRESSION:  1. No fracture or subluxation of the thoracic or lumbar spine.  2. Mild multilevel degenerative changes of the thoracic and lumbar  spine.    COGNITION: Overall cognitive status: Within functional limits for tasks assessed   SENSATION: Not tested  POSTURE:  rounded shoulders and forward head  Cervical ROM:    Active AROM (deg) Eval *  Flexion 25  Extension 50  Right lateral flexion   Left lateral flexion   Right rotation 45  Left rotation 40  (Blank rows = not tested) *very slow, guarded, tremorous movements, painful  LUMBAR ROM:  NT today   Active  AROM  01/15/22  Flexion To mid shin  Extension 100% limited  Right  lateral flexion 95% limited  Left lateral flexion 95% limited  Right rotation 95% limited  Left rotation 95% limited    (Blank rows = not tested)   UPPER EXTREMITY MMT:  MMT Right eval Left eval  Shoulder flexion 4* 5  Shoulder extension    Shoulder abduction 5 5  Shoulder adduction    Shoulder internal rotation 5 5  Shoulder external rotation 4+* 5  Elbow flexion 5* 5  Elbow extension 5* 5  Wrist flexion 5* 5  Wrist extension 5* 5  Grip  strength Good* good   (Blank rows = not tested) *all movements very slow, tremorous on R, increased pain with movement.   LOWER EXTREMITY MMT:  4/5 bil LE strength, limited by pain and gaurding.    GAIT: Gait pattern: wide BOS Distance walked: 53' Assistive device utilized: None Level of assistance: Complete Independence Comments: visually slow, gait speed 0.68 m/s  FUNCTIONAL TESTS:  5 times sit to stand: 01/15/22- 28 sec with bil UE assist.  Dynamic Gait Index: TBA MCTSIB: 01/15/22- position 1: 30 sec, position 2: 30 sec increased sway, position 3: 30 sec increased sway position 4: 3 seconds    PATIENT SURVEYS:  DHI 60% impairment  VESTIBULAR ASSESSMENT:  GENERAL OBSERVATION: no apparent distress, resting neck tremor, tremor lifting R arm, very slow guarded movements, increased pain with all movements and resistance, reporting increasing headache and nausea with eye movements, tearful.     SYMPTOM BEHAVIOR:  Subjective history: reports initially unable to drive, room spinning, felt drunk, felt that way all weekend and didn't start to calm down until Tuesday.   Feels like may head is always shaking, like it wont stop.    Non-Vestibular symptoms: neck pain, headaches, and tremor  Type of dizziness: Imbalance (Disequilibrium), "Funny feeling in the head", and "Swimmyheaded"  Frequency: daily  Duration: variable  Aggravating factors: Induced by motion: driving and activity in general, Occurs when standing still , and when not concentrating on something, bright lights, sirens  Relieving factors: rest, slow movements, and avoid busy/distracting environments  Progression of symptoms: unchanged  OCULOMOTOR EXAM:  Ocular Alignment: normal  Ocular ROM: No Limitations  Spontaneous Nystagmus: absent  Gaze-Induced Nystagmus: absent  Smooth Pursuits: saccades  Saccades: dysmetria  Convergence/Divergence: 12 cm but has to focus, prepare   Vestibular/Ocular Motor Screening  Headache  (0-10) Dizziness (0-10) Nausea (0-10)  Fogginess (0-10) Baseline      0   2   0   0  Smooth Pursuits:     2   20 closed eyes  7   0 Saccades- Horizontal:     4   8   0   0 Saccades- Vertical:     8   0   0   0  Convergence ([place]) (Abnormal: Near [place] of convergence ? 6 cm from the tip of the nose) Measure 1:12 cm    2   5/6   0   0 Measure 2: NT                   Measure 3:  NT                    VOR- Horizontal:                 VOR- Vertical :                Visual Motion Sensitivity  (VOR Cancellation):  VESTIBULAR - OCULAR REFLEX:   Slow VOR:   VOR Cancellation:   Head-Impulse Test:   Dynamic Visual Acuity: Static: impaired, having difficulty focusing, words blurring   POSITIONAL TESTING: NT      OTHOSTATICS: NT  FUNCTIONAL GAIT:  Gait speed 0.68 m/s, wide BOS, no AD    TREATMENT:                                                                                                   DATE:   02/06/22 Therapeutic Exercise: to improve strength and mobility.  Demo, verbal and tactile cues throughout for technique. Seated horzintaal ABD YTB x 10  Seated ER YTB x 10  Seated bicep curl YTB x 10 Seated tricep extension x 10   Manual Therapy: to decrease muscle spasm and pain and improve mobility  STM to R bicep mucle belly, bil UT, levator, and rhomboids  02/04/22 Therapeutic Exercise: to improve strength and mobility.  Demo, verbal and tactile cues throughout for technique. Cervical rotation with pillow case for support - inc dizziness after 5 reps Seated horizontal ABD and ER x 10 each  Manual Therapy: to decrease muscle spasm and pain and improve mobility STM to bil suboccipitals, L UT and rhomboids  01/21/2022 Therapeutic Exercise: to improve strength and mobility.  Demo, verbal and tactile cues throughout for technique. Gentle shoulder shrugs Cervical AROM in non-painful ROM Chin tucks Scap squeezes Review of supine exercises (demo  only) Therapeutic Activity:  set up and education for use of TENS unit.  Education on settings, care of electrodes, safety and precautions.   Modalities: Estim (TENS) to bil UT x 10 min with MHP to neck and back.    01/15/2022 Therapeutic Activity:  to further assess back, neck and dizziness.  5x STS, lumbar AROM, mCTSIB Therapeutic Exercise: to improve strength and mobility.  Demo, verbal and tactile cues throughout for technique. Supine LTR - very small ROM x 15 Pelvic tilts x 10  SKTC stretch bil with towel Manual Therapy: to decrease muscle spasm and pain and improve mobility STM/TPR to cervical paraspinals, massetor/temporalis, bil UT, MFR bil UT and pecs Modalities: MHP to low back and neck in supine.    01/09/2022 Self Care: education on findings, POC, recommendations to rest frequently, limit screen time, and TENS unit - will talk to lawyer about purchase.    PATIENT EDUCATION: Education details: initial HEP Person educated: Patient Education method: Theatre stage manager Education comprehension: verbalized understanding, returned demonstration, and needs further education  HOME EXERCISE PROGRAM:  Access Code: [email or web address] URL: [email or web address] Date: 01/21/2022 Prepared by: Glenetta Hew  Exercises - Supine Lower Trunk Rotation  - 2 x daily - 7 x weekly - 1 sets - 10 reps - Supine Posterior Pelvic Tilt  - 2 x daily - 7 x weekly - 1 sets - 10 reps - 3 sec hold - Hooklying Single Knee to Chest Stretch  - 2 x daily - 7 x weekly - 1 sets - 5 reps - 10-15 sec  hold - Seated Cervical Retraction  - 3 x daily - 7 x weekly -  1 sets - 10 reps - Seated Cervical Rotation AROM  - 3 x daily - 7 x weekly - 1 sets - 10 reps - Seated Shoulder Shrug Circles AROM Backward  - 3 x daily - 7 x weekly - 1 sets - 10 reps - Seated Scapular Retraction  - 3 x daily - 7 x weekly - 1 sets - 10 reps - Supine Chin Tuck  - 2 x daily - 7 x weekly - 1 sets - 10 reps - Supine  Diaphragmatic Breathing  - 2 x daily - 7 x weekly - 1 sets - 10 reps  GOALS: Goals reviewed with patient? Yes  SHORT TERM GOALS: Target date: 01/23/2022   Patient will report compliance with initial HEP.  Baseline: Goal status: MET  2.  Patient will complete assessment of low back pain Baseline:   Goal status: MET  LONG TERM GOALS: Target date: 03/06/2022    Patient will be independent with progressed HEP to improve outcomes and carryover.  Baseline:  Goal status: IN PROGRESS  2.  Patient will report 75% improvement in dizziness. Baseline:  Goal status: IN PROGRESS   3.  Patient will report <40% impairment on DHI to demonstrate improved QOL. Baseline: 60% moderate disability Goal status: IN PROGRESS  4.  Patient will report 75% improvement in neck pain to improve QOL.  Baseline: constant  Goal status: IN PROGRESS  5.  Patient will demonstrate full pain free cervical ROM for safety with driving.  Baseline: see objective, extremely limited, slow, tremorous Goal status: IN PROGRESS  6.  Patient will report 75% improvement in low back pain to improve QOL.  Baseline:  Goal status: IN PROGRESS  7.  Patient will demonstrate full pain free lumbar ROM to perform ADLs.   Baseline: unable to bend forward, see objective Goal status: IN PROGRESS  8.  Patient will demonstrate improved functional strength as demonstrated by 5x STS <14 seconds. Baseline: 01/15/2022 - 28 seconds with bil UE support Goal status: IN PROGRESS  9. Patient will demonstrate full R shoulder strength and ROM not limited by pain.  Baseline: see objective Goal status: IN PROGRESS    ASSESSMENT:  CLINICAL IMPRESSION: Pt arrived with increased pain in R bicep with no known cause. She also reported that she was lightheaded coming in today. She reports 30% improvement in dizziness overall. She had cogwheel like shaking in R shoulder after TB exercises. She was very tender in her R medial border of  scapula.   OBJECTIVE IMPAIRMENTS: decreased activity tolerance, decreased endurance, decreased mobility, decreased ROM, decreased strength, dizziness, impaired perceived functional ability, increased muscle spasms, impaired UE functional use, impaired vision/preception, postural dysfunction, and pain.   ACTIVITY LIMITATIONS: carrying, lifting, bending, sitting, standing, stairs, transfers, dressing, reach over head, and locomotion level  PARTICIPATION LIMITATIONS: meal prep, cleaning, laundry, driving, shopping, community activity, and occupation  PERSONAL FACTORS: Fitness and 3+ comorbidities: concussion, RA, GERD, smoker, C-section x 2  are also affecting patient's functional outcome.   REHAB POTENTIAL: Good  CLINICAL DECISION MAKING: Evolving/moderate complexity  EVALUATION COMPLEXITY: Moderate   PLAN:  PT FREQUENCY: 1-2x/week  PT DURATION: 8 weeks  PLANNED INTERVENTIONS: Therapeutic exercises, Therapeutic activity, Neuromuscular re-education, Balance training, Gait training, Patient/Family education, Self Care, Joint mobilization, Stair training, Vestibular training, Canalith repositioning, Aquatic Therapy, Dry Needling, Electrical stimulation, Spinal mobilization, Cryotherapy, Moist heat, Taping, Traction, Ultrasound, Manual therapy, and Re-evaluation  PLAN FOR NEXT SESSION: progress HEP for postural strengthening, manual therapy and modalities PRN. Add VOR exercises  if tolerates.    Artist Pais, PTA 02/06/2022, 11:10 AM
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
## 2022-02-10 ENCOUNTER — Telehealth: Payer: Self-pay | Admitting: Registered Nurse

## 2022-02-10 ENCOUNTER — Ambulatory Visit: Payer: Self-pay | Admitting: Physical Therapy

## 2022-02-10 DIAGNOSIS — M6281 Muscle weakness (generalized): Secondary | ICD-10-CM

## 2022-02-10 DIAGNOSIS — M542 Cervicalgia: Secondary | ICD-10-CM

## 2022-02-10 DIAGNOSIS — R42 Dizziness and giddiness: Secondary | ICD-10-CM

## 2022-02-10 NOTE — Telephone Encounter (Signed)
error 

## 2022-02-13 ENCOUNTER — Ambulatory Visit: Payer: BC Managed Care – PPO | Admitting: Family Medicine

## 2022-02-14 ENCOUNTER — Ambulatory Visit (INDEPENDENT_AMBULATORY_CARE_PROVIDER_SITE_OTHER): Payer: Self-pay | Admitting: Family Medicine

## 2022-02-14 ENCOUNTER — Encounter: Payer: Self-pay | Admitting: Family Medicine

## 2022-02-14 VITALS — BP 124/82 | Ht 62.0 in | Wt 185.0 lb

## 2022-02-14 DIAGNOSIS — F985 Adult onset fluency disorder: Secondary | ICD-10-CM

## 2022-02-14 DIAGNOSIS — F419 Anxiety disorder, unspecified: Secondary | ICD-10-CM | POA: Insufficient documentation

## 2022-02-14 DIAGNOSIS — S060X0D Concussion without loss of consciousness, subsequent encounter: Secondary | ICD-10-CM

## 2022-02-14 MED ORDER — NORTRIPTYLINE HCL 25 MG PO CAPS: 25.0000 mg | ORAL_CAPSULE | Freq: Every day | ORAL | 2 refills | Status: DC
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
## 2022-02-14 NOTE — Patient Instructions (Signed)
Good to see you Please continue the exercises  Please continue physical therapy  We have referred you to the psychologist   Please send me a message in [place] with any questions or updates.  Please see me back in 4 weeks.   --Dr. Raeford Razor
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
## 2022-02-14 NOTE — Assessment & Plan Note (Signed)
Seems to have regressed a bit from her last visit.  Has been to physical therapy and improving with the pain around her back and neck.  Anxiety seems to be the main component of her concussion -Counseled on home exercise therapy and supportive care. -Nortriptyline.   -Referral to counseling.
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
## 2022-02-17 ENCOUNTER — Ambulatory Visit: Payer: Self-pay

## 2022-02-17 DIAGNOSIS — M542 Cervicalgia: Secondary | ICD-10-CM

## 2022-02-17 DIAGNOSIS — R42 Dizziness and giddiness: Secondary | ICD-10-CM

## 2022-02-17 DIAGNOSIS — M5459 Other low back pain: Secondary | ICD-10-CM

## 2022-02-17 DIAGNOSIS — M6281 Muscle weakness (generalized): Secondary | ICD-10-CM
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
## 2022-02-20 ENCOUNTER — Encounter: Payer: Self-pay | Admitting: Family Medicine

## 2022-02-20 ENCOUNTER — Ambulatory Visit: Payer: Self-pay | Admitting: Physical Therapy

## 2022-02-20 ENCOUNTER — Encounter: Payer: Self-pay | Admitting: Physical Therapy

## 2022-02-20 ENCOUNTER — Ambulatory Visit (INDEPENDENT_AMBULATORY_CARE_PROVIDER_SITE_OTHER): Payer: Self-pay | Admitting: Family Medicine

## 2022-02-20 VITALS — BP 119/55 | HR 94 | Temp 98.6°F | Resp 16 | Ht 62.0 in | Wt 189.4 lb

## 2022-02-20 DIAGNOSIS — M5459 Other low back pain: Secondary | ICD-10-CM

## 2022-02-20 DIAGNOSIS — H538 Other visual disturbances: Secondary | ICD-10-CM

## 2022-02-20 DIAGNOSIS — M6281 Muscle weakness (generalized): Secondary | ICD-10-CM

## 2022-02-20 DIAGNOSIS — R42 Dizziness and giddiness: Secondary | ICD-10-CM

## 2022-02-20 DIAGNOSIS — F985 Adult onset fluency disorder: Secondary | ICD-10-CM

## 2022-02-20 DIAGNOSIS — M542 Cervicalgia: Secondary | ICD-10-CM

## 2022-02-20 DIAGNOSIS — R251 Tremor, unspecified: Secondary | ICD-10-CM

## 2022-02-20 NOTE — Patient Instructions (Signed)
Thank you for choosing  Primary Care at [facility] for your Primary Care needs. I am excited for the opportunity to partner with you to meet your health care goals. It was a pleasure meeting you today!  Information on diet, exercise, and health maintenance recommendations are listed below. This is information to help you be sure you are on track for optimal health and monitoring.   Please look over this and let us know if you have any questions or if you have completed any of the health maintenance outside of  so that we can be sure your records are up to date.  ___________________________________________________________  MyChart:  For all urgent or time sensitive needs we ask that you please call the office to avoid delays. Our number is [phone]. MyChart is not constantly monitored and due to the large volume of messages a day, replies may take up to 72 business hours.  MyChart Policy: MyChart allows for you to see your visit notes, after visit summary, provider recommendations, lab and tests results, make an appointment, request refills, and contact your provider or the office for non-urgent questions or concerns. Providers are seeing patients during normal business hours and do not have built in time to review MyChart messages.  We ask that you allow a minimum of 3 business days for responses to MyChart messages. For this reason, please do not send urgent requests through MyChart. Please call the office at [phone]. New and ongoing conditions may require a visit. We have virtual and in-person visits available for your convenience.  Complex MyChart concerns may require a visit. Your provider may request you schedule a virtual or in-person visit to ensure we are providing the best care possible. MyChart messages sent after 11:00 AM on Friday will not be received by the provider until Monday morning.    Lab and Test Results: You will receive your lab and test  results on MyChart as soon as they are completed and results have been sent by the lab or testing facility. Due to this service, you will receive your results BEFORE your provider.  I review lab and test results each morning prior to seeing patients. Some results require collaboration with other providers to ensure you are receiving the most appropriate care. For this reason, we ask that you please allow a minimum of 3-5 business days from the time that ALL results have been received for your provider to receive and review lab and test results and contact you about these.  Most lab and test result comments from the provider will be sent through MyChart. Your provider may recommend changes to the plan of care, follow-up visits, repeat testing, ask questions, or request an office visit to discuss these results. You may reply directly to this message or call the office to provide information for the provider or set up an appointment. In some instances, you will be called with test results and recommendations. Please let us know if this is preferred and we will make note of this in your chart to provide this for you.    If you have not heard a response to your lab or test results in 5 business days from all results returning to MyChart, please call the office to let us know. We ask that you please avoid calling prior to this time unless there is an emergent concern. Due to high call volumes, this can delay the resulting process.  After Hours: For all non-emergency after hours needs, please   call the office at [phone] and select the option to reach the on-call  service. On-call services are shared between multiple [place] offices and therefore it will not be possible to speak directly with your provider. On-call providers may provide medical advice and recommendations, but are unable to provide refills for maintenance medications.  For all emergency or urgent medical needs after normal business hours, we  recommend that you seek care at the closest Urgent Care or Emergency Department to ensure appropriate treatment in a timely manner.  [facility] has a 24 hour emergency room located on the ground floor for your convenience.   Urgent Concerns During the Business Day Providers are seeing patients from 8AM to 5PM with a busy schedule and are most often not able to respond to non-urgent calls until the end of the day or the next business day. If you should have URGENT concerns during the day, please call and speak to the nurse or schedule a same day appointment so that we can address your concern without delay.   Thank you, again, for choosing me as your health care partner. I appreciate your trust and look forward to learning more about you!   Raghad Lorenz B. Havier Deeb, DNP, FNP-C  ___________________________________________________________  Health Maintenance Recommendations Screening Testing Mammogram Every 1-2 years based on history and risk factors Starting at age 50 Pap Smear Ages 21-39 every 3 years Ages 30-65 every 5 years with HPV testing More frequent testing may be required based on results and history Colon Cancer Screening Every 1-10 years based on test performed, risk factors, and history Starting at age 45 Bone Density Screening Every 2-10 years based on history Starting at age 65 for women Recommendations for men differ based on medication usage, history, and risk factors AAA Screening One time ultrasound Men 65-75 years old who have ever smoked Lung Cancer Screening Low Dose Lung CT every 12 months Age 50-80 years with a 20 pack-year smoking history who still smoke or who have quit within the last 15 years  Screening Labs Routine  Labs: Complete Blood Count (CBC), Complete Metabolic Panel (CMP), Cholesterol (Lipid Panel) Every 6-12 months based on history and medications May be recommended more frequently based on current conditions or previous results Hemoglobin  A1c Lab Every 3-12 months based on history and previous results Starting at age 45 or earlier with diagnosis of diabetes, high cholesterol, BMI >26, and/or risk factors Frequent monitoring for patients with diabetes to ensure blood sugar control Thyroid Panel  Every 6 months based on history, symptoms, and risk factors May be repeated more often if on medication HIV One time testing for all patients 13 and older May be repeated more frequently for patients with increased risk factors or exposure Hepatitis C One time testing for all patients 18 and older May be repeated more frequently for patients with increased risk factors or exposure Gonorrhea, Chlamydia Every 12 months for all sexually active persons 13-24 years Additional monitoring may be recommended for those who are considered high risk or who have symptoms PSA Men 40-54 years old with risk factors Additional screening may be recommended from age 55-69 based on risk factors, symptoms, and history  Vaccine Recommendations Tetanus Booster All adults every 10 years Flu Vaccine All patients 6 months and older every year COVID Vaccine All patients 12 years and older Initial dosing with booster May recommend additional booster based on age and health history HPV Vaccine 2 doses all patients age 9-26 Dosing may be considered   for patients over 26 Shingles Vaccine (Shingrix) 2 doses all adults 50 years and older Pneumonia (Pneumovax 23) All adults 65 years and older May recommend earlier dosing based on health history Pneumonia (Prevnar 13) All adults 65 years and older Dosed 1 year after Pneumovax 23 Pneumonia (Prevnar 20) All adults 65 years and older (adults 19-64 with certain conditions or risk factors) 1 dose  For those who have not received Prevnar 13 vaccine previously   Additional Screening, Testing, and Vaccinations may be recommended on an individualized basis based on family history, health history, risk  factors, and/or exposure.  __________________________________________________________  Diet Recommendations for All Patients  I recommend that all patients maintain a diet low in saturated fats, carbohydrates, and cholesterol. While this can be challenging at first, it is not impossible and small changes can make big differences.  Things to try: Decreasing the amount of soda, sweet tea, and/or juice to one or less per day and replace with water While water is always the first choice, if you do not like water you may consider adding a water additive without sugar to improve the taste other sugar free drinks Replace potatoes with a brightly colored vegetable  Use healthy oils, such as canola oil or olive oil, instead of butter or hard margarine Limit your bread intake to two pieces or less a day Replace regular pasta with low carb pasta options Bake, broil, or grill foods instead of frying Monitor portion sizes  Eat smaller, more frequent meals throughout the day instead of large meals  An important thing to remember is, if you love foods that are not great for your health, you don't have to give them up completely. Instead, allow these foods to be a reward when you have done well. Allowing yourself to still have special treats every once in a while is a nice way to tell yourself thank you for working hard to keep yourself healthy.   Also remember that every day is a new day. If you have a bad day and "fall off the wagon", you can still climb right back up and keep moving along on your journey!  We have resources available to help you!  Some websites that may be helpful include: www.MyPlate.gov  Www.VeryWellFit.com _____________________________________________________________  Activity Recommendations for All Patients  I recommend that all adults get at least 20 minutes of moderate physical activity that elevates your heart rate at least 5 days out of the week.  Some examples  include: Walking or jogging at a pace that allows you to carry on a conversation Cycling (stationary bike or outdoors) Water aerobics Yoga Weight lifting Dancing If physical limitations prevent you from putting stress on your joints, exercise in a pool or seated in a chair are excellent options.  Do determine your MAXIMUM heart rate for activity: 220 - YOUR AGE = MAX Heart Rate   Remember! Do not push yourself too hard.  Start slowly and build up your pace, speed, weight, time in exercise, etc.  Allow your body to rest between exercise and get good sleep. You will need more water than normal when you are exerting yourself. Do not wait until you are thirsty to drink. Drink with a purpose of getting in at least 8, 8 ounce glasses of water a day plus more depending on how much you exercise and sweat.    If you begin to develop dizziness, chest pain, abdominal pain, jaw pain, shortness of breath, headache, vision changes, lightheadedness, or other concerning symptoms,   stop the activity and allow your body to rest. If your symptoms are severe, seek emergency evaluation immediately. If your symptoms are concerning, but not severe, please let us know so that we can recommend further evaluation.     
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
## 2022-02-24 ENCOUNTER — Ambulatory Visit: Payer: Self-pay

## 2022-02-24 DIAGNOSIS — M6281 Muscle weakness (generalized): Secondary | ICD-10-CM

## 2022-02-24 DIAGNOSIS — R42 Dizziness and giddiness: Secondary | ICD-10-CM

## 2022-02-24 DIAGNOSIS — M5459 Other low back pain: Secondary | ICD-10-CM

## 2022-02-24 DIAGNOSIS — M542 Cervicalgia: Secondary | ICD-10-CM
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
## 2022-02-27 ENCOUNTER — Ambulatory Visit: Payer: Self-pay | Admitting: Physical Therapy

## 2022-02-27 ENCOUNTER — Encounter: Payer: Self-pay | Admitting: Physical Therapy

## 2022-02-27 DIAGNOSIS — M542 Cervicalgia: Secondary | ICD-10-CM

## 2022-02-27 DIAGNOSIS — M5459 Other low back pain: Secondary | ICD-10-CM

## 2022-02-27 DIAGNOSIS — M6281 Muscle weakness (generalized): Secondary | ICD-10-CM

## 2022-02-27 DIAGNOSIS — R42 Dizziness and giddiness: Secondary | ICD-10-CM
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
## 2022-02-28 ENCOUNTER — Encounter: Payer: Self-pay | Admitting: Physical Therapy

## 2022-02-28 ENCOUNTER — Other Ambulatory Visit: Payer: Self-pay | Admitting: *Deleted

## 2022-02-28 ENCOUNTER — Telehealth: Payer: Self-pay | Admitting: Physical Therapy

## 2022-02-28 ENCOUNTER — Ambulatory Visit: Payer: Self-pay | Admitting: Physical Therapy

## 2022-02-28 DIAGNOSIS — F985 Adult onset fluency disorder: Secondary | ICD-10-CM

## 2022-02-28 DIAGNOSIS — M542 Cervicalgia: Secondary | ICD-10-CM

## 2022-02-28 DIAGNOSIS — R42 Dizziness and giddiness: Secondary | ICD-10-CM

## 2022-02-28 DIAGNOSIS — S060X0D Concussion without loss of consciousness, subsequent encounter: Secondary | ICD-10-CM

## 2022-02-28 DIAGNOSIS — J3800 Paralysis of vocal cords and larynx, unspecified: Secondary | ICD-10-CM

## 2022-02-28 DIAGNOSIS — M6281 Muscle weakness (generalized): Secondary | ICD-10-CM

## 2022-02-28 DIAGNOSIS — R413 Other amnesia: Secondary | ICD-10-CM

## 2022-02-28 DIAGNOSIS — M5459 Other low back pain: Secondary | ICD-10-CM
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
## 2022-03-03 ENCOUNTER — Ambulatory Visit: Payer: Self-pay | Admitting: Physical Therapy

## 2022-03-03 ENCOUNTER — Ambulatory Visit: Payer: Self-pay

## 2022-03-03 ENCOUNTER — Encounter: Payer: Self-pay | Admitting: Physical Therapy

## 2022-03-03 DIAGNOSIS — R42 Dizziness and giddiness: Secondary | ICD-10-CM

## 2022-03-03 DIAGNOSIS — M542 Cervicalgia: Secondary | ICD-10-CM

## 2022-03-03 DIAGNOSIS — M5459 Other low back pain: Secondary | ICD-10-CM

## 2022-03-03 DIAGNOSIS — M6281 Muscle weakness (generalized): Secondary | ICD-10-CM

## 2022-03-04 LAB — HM DIABETES EYE EXAM
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
## 2022-03-06 ENCOUNTER — Encounter: Payer: BC Managed Care – PPO | Admitting: Physical Therapy

## 2022-03-06 ENCOUNTER — Ambulatory Visit: Payer: Medicaid Other | Attending: Family Medicine | Admitting: Physical Therapy

## 2022-03-06 ENCOUNTER — Encounter: Payer: Self-pay | Admitting: Physical Therapy

## 2022-03-06 ENCOUNTER — Telehealth: Payer: Self-pay

## 2022-03-06 DIAGNOSIS — M5459 Other low back pain: Secondary | ICD-10-CM | POA: Diagnosis present

## 2022-03-06 DIAGNOSIS — R41841 Cognitive communication deficit: Secondary | ICD-10-CM | POA: Diagnosis present

## 2022-03-06 DIAGNOSIS — F985 Adult onset fluency disorder: Secondary | ICD-10-CM | POA: Insufficient documentation

## 2022-03-06 DIAGNOSIS — M542 Cervicalgia: Secondary | ICD-10-CM | POA: Diagnosis present

## 2022-03-06 DIAGNOSIS — M6281 Muscle weakness (generalized): Secondary | ICD-10-CM | POA: Insufficient documentation

## 2022-03-06 DIAGNOSIS — R42 Dizziness and giddiness: Secondary | ICD-10-CM | POA: Insufficient documentation

## 2022-03-06 NOTE — Telephone Encounter (Signed)
I just was wondering if we needed to resend in neurology referral to Neurology at [facility] because we got a fax saying that she is established there already.
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
## 2022-03-10 ENCOUNTER — Other Ambulatory Visit: Payer: Self-pay

## 2022-03-10 ENCOUNTER — Encounter: Payer: Self-pay | Admitting: Physical Therapy

## 2022-03-10 ENCOUNTER — Ambulatory Visit: Payer: Medicaid Other | Admitting: Physical Therapy

## 2022-03-10 ENCOUNTER — Ambulatory Visit: Payer: Medicaid Other

## 2022-03-10 DIAGNOSIS — R41841 Cognitive communication deficit: Secondary | ICD-10-CM

## 2022-03-10 DIAGNOSIS — M542 Cervicalgia: Secondary | ICD-10-CM

## 2022-03-10 DIAGNOSIS — F985 Adult onset fluency disorder: Secondary | ICD-10-CM

## 2022-03-10 DIAGNOSIS — R42 Dizziness and giddiness: Secondary | ICD-10-CM

## 2022-03-10 DIAGNOSIS — M6281 Muscle weakness (generalized): Secondary | ICD-10-CM

## 2022-03-10 DIAGNOSIS — M5459 Other low back pain: Secondary | ICD-10-CM
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
## 2022-03-13 ENCOUNTER — Ambulatory Visit: Payer: BC Managed Care – PPO | Admitting: Family Medicine

## 2022-03-13 ENCOUNTER — Encounter: Payer: Self-pay | Admitting: Physical Therapy

## 2022-03-13 ENCOUNTER — Ambulatory Visit: Payer: Medicaid Other | Admitting: Physical Therapy

## 2022-03-13 DIAGNOSIS — R42 Dizziness and giddiness: Secondary | ICD-10-CM

## 2022-03-13 DIAGNOSIS — M6281 Muscle weakness (generalized): Secondary | ICD-10-CM

## 2022-03-13 DIAGNOSIS — M5459 Other low back pain: Secondary | ICD-10-CM

## 2022-03-13 DIAGNOSIS — M542 Cervicalgia: Secondary | ICD-10-CM
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
## 2022-03-17 ENCOUNTER — Ambulatory Visit: Payer: Medicaid Other | Admitting: Physical Therapy

## 2022-03-17 ENCOUNTER — Ambulatory Visit: Payer: Self-pay

## 2022-03-17 ENCOUNTER — Ambulatory Visit (INDEPENDENT_AMBULATORY_CARE_PROVIDER_SITE_OTHER): Payer: Medicaid Other | Admitting: Family Medicine

## 2022-03-17 ENCOUNTER — Encounter: Payer: Self-pay | Admitting: Physical Therapy

## 2022-03-17 VITALS — BP 130/84 | Ht 62.0 in | Wt 185.0 lb

## 2022-03-17 DIAGNOSIS — M778 Other enthesopathies, not elsewhere classified: Secondary | ICD-10-CM

## 2022-03-17 DIAGNOSIS — R42 Dizziness and giddiness: Secondary | ICD-10-CM | POA: Diagnosis not present

## 2022-03-17 DIAGNOSIS — F985 Adult onset fluency disorder: Secondary | ICD-10-CM | POA: Diagnosis not present

## 2022-03-17 DIAGNOSIS — S060X0D Concussion without loss of consciousness, subsequent encounter: Secondary | ICD-10-CM | POA: Diagnosis not present

## 2022-03-17 DIAGNOSIS — M7581 Other shoulder lesions, right shoulder: Secondary | ICD-10-CM | POA: Insufficient documentation

## 2022-03-17 MED ORDER — TRIAMCINOLONE ACETONIDE 40 MG/ML IJ SUSP
40.0000 mg | Freq: Once | INTRAMUSCULAR | Status: AC
  Administered 2022-03-17: 40 mg via INTRA_ARTICULAR

## 2022-03-17 NOTE — Assessment & Plan Note (Signed)
Acutely occurring.  Having more tightness around the shoulder as opposed to the tremoring she was previously experiencing.  Having trouble reaching out reaching up. -Counseled on home exercise therapy and supportive care. - injection  - continue with physical therapy  - could consider further imaging.
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
## 2022-03-17 NOTE — Assessment & Plan Note (Signed)
Having improvement since doing speech therapy.  She only notices this with saying certain words -Continue speech therapy. -Could consider MRI of the brain or referral to neurology.
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
## 2022-03-17 NOTE — Patient Instructions (Signed)
Good to see you We'll continue with physical therapy  Please continue the nortriptyline   Please use ice as needed on the shoulder Please send me a message in MyChart with any questions or updates.  Please see me back in 5 weeks.   --Dr. Raeford Razor

## 2022-03-18 ENCOUNTER — Ambulatory Visit: Payer: Medicaid Other

## 2022-03-18 DIAGNOSIS — R42 Dizziness and giddiness: Secondary | ICD-10-CM | POA: Diagnosis not present

## 2022-03-18 DIAGNOSIS — F985 Adult onset fluency disorder: Secondary | ICD-10-CM

## 2022-03-18 DIAGNOSIS — R41841 Cognitive communication deficit: Secondary | ICD-10-CM
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
## 2022-03-18 NOTE — Patient Instructions (Signed)
  Tips to help falling asleep:  Try using white, green, or brown noise  Don't use any electronics within 2-3 hours of going to bed  Use the 4-7-8 breathing pattern Steps to complete one cycle: Place the tip of your tongue behind your upper front teeth. Exhale completely through your mouth and make a "whoosh" sound. Close your mouth and inhale through your nose while mentally counting to 4. Hold your breath and mentally count to 7. Open your mouth and exhale completely, making a "whoosh" sound while mentally counting to 8. Repeat this cycle at least three more times.  Don't exercise 2 hours before bed, but exercise during the day  Practice yoga (slow stretching), mindfulness, and meditation  Avoid naps during the day  Listen to relaxing music  Don't eat within 2 hours before you go to bed  Use essential oils such as lavender and vanilla to calm down 1-2 hours before bed  Limit caffeine intake - especially 6 hours before bedtime  Try focusing on staying awake (paradoxical intention) in order to fall asleep  Try sleep-enhancing supplements magnesium 5-HTP (5-hydroxytryptophan) melatonin L-theanine GABA (gamma-aminobutyric acid)
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
## 2022-03-20 ENCOUNTER — Ambulatory Visit: Payer: Medicaid Other | Admitting: Physical Therapy

## 2022-03-20 ENCOUNTER — Ambulatory Visit: Payer: Medicaid Other

## 2022-03-20 ENCOUNTER — Encounter: Payer: Self-pay | Admitting: Physical Therapy

## 2022-03-20 DIAGNOSIS — F985 Adult onset fluency disorder: Secondary | ICD-10-CM

## 2022-03-20 DIAGNOSIS — R42 Dizziness and giddiness: Secondary | ICD-10-CM | POA: Diagnosis not present

## 2022-03-20 DIAGNOSIS — R41841 Cognitive communication deficit: Secondary | ICD-10-CM

## 2022-03-20 DIAGNOSIS — M6281 Muscle weakness (generalized): Secondary | ICD-10-CM
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
## 2022-03-20 NOTE — Patient Instructions (Addendum)
As you became more comfortable with me today, your speech fluency got better.   Memory Compensation Strategies  Use "WARM" strategy. W= write it down A=  associate it R=  repeat it M=  make a mental picture  You can keep a Memory Notebook. Use a 3-ring notebook with sections for the following:  calendar, important names and phone numbers, medications, doctors' names/phone numbers, "to do list"/reminders, and a section to journal what you did each day  Use a calendar to write appointments down.  Write yourself a schedule for the day.  This can be placed on the calendar or in a separate section of the Memory Notebook.  Keeping a regular schedule can help memory.  Use medication organizer with sections for each day or morning/evening pills  You may need help loading it  Keep a basket, or pegboard by the door.   Place items that you need to take out with you in the basket or on the pegboard.  You may also want to include a message board for reminders.  Use sticky notes. Place sticky notes with reminders in a place where the task is performed.  For example:  "turn off the stove" placed by the stove, "lock the door" placed on the door at eye level, "take your medications" on the bathroom mirror or by the place where you normally take your medications  Use alarms, timers, and/or a reminder app. Use while cooking to remind yourself to check on food or as a reminder to take your medicine, or as a reminder to make a call, or as a reminder to perform another task, etc.  Use a voice recorder app or small tape recorder to record important information and notes for yourself. Go back at the end of the day and listen to these.

## 2022-03-24 ENCOUNTER — Encounter: Payer: Self-pay | Admitting: Family Medicine

## 2022-03-24 ENCOUNTER — Ambulatory Visit (INDEPENDENT_AMBULATORY_CARE_PROVIDER_SITE_OTHER): Payer: Medicaid Other | Admitting: Family Medicine

## 2022-03-24 ENCOUNTER — Ambulatory Visit: Payer: Medicaid Other | Admitting: Physical Therapy

## 2022-03-24 ENCOUNTER — Encounter: Payer: Self-pay | Admitting: Physical Therapy

## 2022-03-24 VITALS — BP 133/54 | HR 86 | Temp 97.9°F | Resp 16 | Ht 62.0 in | Wt 193.0 lb

## 2022-03-24 DIAGNOSIS — H6991 Unspecified Eustachian tube disorder, right ear: Secondary | ICD-10-CM | POA: Diagnosis not present

## 2022-03-24 DIAGNOSIS — D72829 Elevated white blood cell count, unspecified: Secondary | ICD-10-CM

## 2022-03-24 DIAGNOSIS — M542 Cervicalgia: Secondary | ICD-10-CM

## 2022-03-24 DIAGNOSIS — R739 Hyperglycemia, unspecified: Secondary | ICD-10-CM

## 2022-03-24 DIAGNOSIS — R42 Dizziness and giddiness: Secondary | ICD-10-CM

## 2022-03-24 DIAGNOSIS — M6281 Muscle weakness (generalized): Secondary | ICD-10-CM

## 2022-03-24 DIAGNOSIS — M5459 Other low back pain: Secondary | ICD-10-CM

## 2022-03-24 DIAGNOSIS — E785 Hyperlipidemia, unspecified: Secondary | ICD-10-CM | POA: Diagnosis not present

## 2022-03-24 DIAGNOSIS — E119 Type 2 diabetes mellitus without complications: Secondary | ICD-10-CM | POA: Insufficient documentation

## 2022-03-24 LAB — CBC WITH DIFFERENTIAL/PLATELET
Basophils Absolute: 0 10*3/uL (ref 0.0–0.1)
Basophils Relative: 0.3 % (ref 0.0–3.0)
Eosinophils Absolute: 0.2 10*3/uL (ref 0.0–0.7)
Eosinophils Relative: 1.2 % (ref 0.0–5.0)
HCT: 43.7 % (ref 36.0–46.0)
Hemoglobin: 14.5 g/dL (ref 12.0–15.0)
Lymphocytes Relative: 17.5 % (ref 12.0–46.0)
Lymphs Abs: 2.2 10*3/uL (ref 0.7–4.0)
MCHC: 33.2 g/dL (ref 30.0–36.0)
MCV: 88.6 fl (ref 78.0–100.0)
Monocytes Absolute: 0.6 10*3/uL (ref 0.1–1.0)
Monocytes Relative: 4.5 % (ref 3.0–12.0)
Neutro Abs: 9.4 10*3/uL — ABNORMAL HIGH (ref 1.4–7.7)
Neutrophils Relative %: 76.5 % (ref 43.0–77.0)
Platelets: 242 10*3/uL (ref 150.0–400.0)
RBC: 4.93 Mil/uL (ref 3.87–5.11)
RDW: 13.9 % (ref 11.5–15.5)
WBC: 12.3 10*3/uL — ABNORMAL HIGH (ref 4.0–10.5)

## 2022-03-24 LAB — COMPREHENSIVE METABOLIC PANEL
ALT: 11 U/L (ref 0–35)
AST: 10 U/L (ref 0–37)
Albumin: 4.2 g/dL (ref 3.5–5.2)
Alkaline Phosphatase: 76 U/L (ref 39–117)
BUN: 27 mg/dL — ABNORMAL HIGH (ref 6–23)
CO2: 29 mEq/L (ref 19–32)
Calcium: 9.5 mg/dL (ref 8.4–10.5)
Chloride: 101 mEq/L (ref 96–112)
Creatinine, Ser: 1.07 mg/dL (ref 0.40–1.20)
GFR: 60.35 mL/min (ref >60.00)
Glucose, Bld: 81 mg/dL (ref 70–99)
Potassium: 4.5 mEq/L (ref 3.5–5.1)
Sodium: 137 mEq/L (ref 135–145)
Total Bilirubin: 0.4 mg/dL (ref 0.2–1.2)
Total Protein: 6.8 g/dL (ref 6.0–8.3)

## 2022-03-24 LAB — LIPID PANEL
Cholesterol: 202 mg/dL — ABNORMAL HIGH (ref 0–200)
HDL: 52.5 mg/dL (ref >39.00)
LDL Cholesterol: 120 mg/dL — ABNORMAL HIGH (ref 0–99)
NonHDL: 149.34
Total CHOL/HDL Ratio: 4
Triglycerides: 149 mg/dL (ref 0.0–149.0)
VLDL: 29.8 mg/dL (ref 0.0–40.0)

## 2022-03-24 LAB — HEMOGLOBIN A1C: Hgb A1c MFr Bld: 6.5 % (ref 4.6–6.5)

## 2022-03-24 LAB — TSH: TSH: 2.24 u[IU]/mL (ref 0.35–5.50)

## 2022-03-24 MED ORDER — ROSUVASTATIN CALCIUM 5 MG PO TABS: 5.0000 mg | ORAL_TABLET | Freq: Every day | ORAL | 3 refills | Status: DC

## 2022-03-24 NOTE — Addendum Note (Signed)
Addended by: Caleen Jobs B on: 03/24/2022 02:44 PM   Modules accepted: Orders

## 2022-03-24 NOTE — Assessment & Plan Note (Signed)
Reports history of borderline HLD per patient. States she had Crestor in the past, but was told to stop taking because levels were borderline.  Labs today Lifestyle factors for lowering cholesterol include: Diet therapy - heart-healthy diet rich in fruits, veggies, fiber-rich whole grains, lean meats, chicken, fish (at least twice a week), fat-free or 1% dairy products; foods low in saturated/trans fats, cholesterol, sodium, and sugar. Mediterranean diet has shown to be very heart healthy. Regular exercise - recommend at least 30 minutes a day, 5 times per week Weight management

## 2022-03-24 NOTE — Assessment & Plan Note (Signed)
Lab Results  Component Value Date   HGBA1C 6.5 09/09/2021   Updating labs today. No current medications for hyperglycemia.  Discussed heart healthy, low-carb diet and regular exercise.

## 2022-03-24 NOTE — Progress Notes (Signed)
Established Patient Office Visit  Subjective   Patient ID: Maria Bailey, female    DOB: 29-Jul-1970  Age: 52 y.o. MRN: [id number]  Chief Complaint  Patient presents with   Follow-up    Right ear was bleeding    HPI   Patient is here for follow-up.   Reports she is making gradual progress. Sports med Dr. Raeford Razor gave her a shoulder injection and that helped significantly. She has about a month left of physical therapy and speech therapy - feels like she has been doing really well. She is using a walking stick for confidence with long distance walks, but doesn't need it for short distances. She is still taking nortriptyline and that seems to be helping significantly. Stuttering is much better, still occurs sometimes especially if she is really tired. States she is not driving. She is here today to follow-up on routine health. Reports hyperlipidemia and hyperglycemia in the past, but no regular medicines. She is willing to do labs today. Asymptomatic today. Patient denies any chest pain, palpitations, dyspnea, wheezing, edema, recurrent headaches, vision changes.   Additionally, she reports her right ear has been bothering [place] States she noticed a little bit of bloody wax when she scratched her here. She denies any pain, just feels like she needs to pop her ear but can't.      ROS All review of systems negative except what is listed in the HPI    Objective:     BP (!) 133/54   Pulse 86   Temp 97.9 F (36.6 C)   Resp 16   Ht 5' 2"$  (1.575 m)   Wt 193 lb (87.5 kg)   SpO2 95%   BMI 35.30 kg/m    Physical Exam Vitals reviewed.  Constitutional:      Appearance: She is normal weight.  HENT:     Head: Normocephalic and atraumatic.  Eyes:     Extraocular Movements: Extraocular movements intact.     Pupils: Pupils are equal, round, and reactive to light.  Cardiovascular:     Rate and Rhythm: Normal rate and regular rhythm.   Pulmonary:     Effort: Pulmonary effort is normal.     Breath sounds: Normal breath sounds.  Musculoskeletal:        General: No swelling.     Cervical back: Normal range of motion and neck supple.  Skin:    General: Skin is warm and dry.  Neurological:     Mental Status: She is alert and oriented to person, place, and time.     Motor: No weakness.     Coordination: Coordination normal.  Psychiatric:        Mood and Affect: Mood normal.        Behavior: Behavior normal.        Thought Content: Thought content normal.        Judgment: Judgment normal.      No results found for any visits on 03/24/22.    The 10-year ASCVD risk score (Arnett DK, et al., 2019) is: 9.1%    Assessment & Plan:   Problem List Items Addressed This Visit       Other   Hyperlipidemia - Primary    Reports history of borderline HLD per patient. States she had Crestor in the past, but was told to stop taking because levels were borderline.  Labs today Lifestyle factors for lowering cholesterol include: Diet therapy - heart-healthy diet rich  in fruits, veggies, fiber-rich whole grains, lean meats, chicken, fish (at least twice a week), fat-free or 1% dairy products; foods low in saturated/trans fats, cholesterol, sodium, and sugar. Mediterranean diet has shown to be very heart healthy. Regular exercise - recommend at least 30 minutes a day, 5 times per week Weight management        Relevant Orders   Comprehensive metabolic panel   CBC with Differential/Platelet   Hemoglobin A1c   TSH   Lipid panel   Hyperglycemia    Lab Results  Component Value Date   HGBA1C 6.5 09/09/2021  Updating labs today. No current medications for hyperglycemia.  Discussed heart healthy, low-carb diet and regular exercise.        Relevant Orders   Comprehensive metabolic panel   CBC with Differential/Platelet   Hemoglobin A1c   TSH   Lipid panel   Other Visit Diagnoses     Dysfunction of right eustachian  tube    Start with Flonase daily. If not improving, refer to ENT.         Return in about 6 months (around 09/22/2022) for routine follow-up.    Terrilyn Saver, NP

## 2022-03-24 NOTE — Patient Instructions (Signed)
Blood work today For [employer], [employer] - recommend Flonase daily. If not improving in a few weeks, let me know and we can refer to ENT to further evaluate and manage.

## 2022-03-25 ENCOUNTER — Ambulatory Visit: Payer: Medicaid Other

## 2022-03-25 DIAGNOSIS — F985 Adult onset fluency disorder: Secondary | ICD-10-CM

## 2022-03-25 DIAGNOSIS — R42 Dizziness and giddiness: Secondary | ICD-10-CM | POA: Diagnosis not present

## 2022-03-25 DIAGNOSIS — R41841 Cognitive communication deficit: Secondary | ICD-10-CM
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
## 2022-03-27 ENCOUNTER — Encounter: Payer: Self-pay | Admitting: Physical Therapy

## 2022-03-27 ENCOUNTER — Ambulatory Visit: Payer: Medicaid Other | Admitting: Physical Therapy

## 2022-03-27 DIAGNOSIS — M5459 Other low back pain: Secondary | ICD-10-CM

## 2022-03-27 DIAGNOSIS — R42 Dizziness and giddiness: Secondary | ICD-10-CM | POA: Diagnosis not present

## 2022-03-27 DIAGNOSIS — M542 Cervicalgia: Secondary | ICD-10-CM

## 2022-03-27 DIAGNOSIS — M6281 Muscle weakness (generalized): Secondary | ICD-10-CM
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
## 2022-03-31 ENCOUNTER — Encounter: Payer: Self-pay | Admitting: Physical Therapy

## 2022-03-31 ENCOUNTER — Ambulatory Visit: Payer: Medicaid Other | Admitting: Physical Therapy

## 2022-03-31 DIAGNOSIS — M542 Cervicalgia: Secondary | ICD-10-CM

## 2022-03-31 DIAGNOSIS — R42 Dizziness and giddiness: Secondary | ICD-10-CM | POA: Diagnosis not present

## 2022-03-31 DIAGNOSIS — M5459 Other low back pain: Secondary | ICD-10-CM

## 2022-03-31 DIAGNOSIS — M6281 Muscle weakness (generalized): Secondary | ICD-10-CM

## 2022-04-03 ENCOUNTER — Encounter: Payer: Self-pay | Admitting: Physical Therapy
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
## 2022-04-04 ENCOUNTER — Encounter: Payer: Self-pay | Admitting: Physical Therapy

## 2022-04-04 ENCOUNTER — Ambulatory Visit: Payer: Medicaid Other | Attending: Family Medicine

## 2022-04-04 ENCOUNTER — Ambulatory Visit: Payer: Medicaid Other | Admitting: Physical Therapy

## 2022-04-04 DIAGNOSIS — R41841 Cognitive communication deficit: Secondary | ICD-10-CM | POA: Insufficient documentation

## 2022-04-04 DIAGNOSIS — F985 Adult onset fluency disorder: Secondary | ICD-10-CM | POA: Diagnosis not present

## 2022-04-04 DIAGNOSIS — M5459 Other low back pain: Secondary | ICD-10-CM | POA: Diagnosis present

## 2022-04-04 DIAGNOSIS — R42 Dizziness and giddiness: Secondary | ICD-10-CM

## 2022-04-04 DIAGNOSIS — M542 Cervicalgia: Secondary | ICD-10-CM | POA: Insufficient documentation

## 2022-04-04 DIAGNOSIS — M6281 Muscle weakness (generalized): Secondary | ICD-10-CM
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
## 2022-04-07 ENCOUNTER — Encounter: Payer: Self-pay | Admitting: Physical Therapy

## 2022-04-07 ENCOUNTER — Ambulatory Visit: Payer: Medicaid Other

## 2022-04-07 ENCOUNTER — Ambulatory Visit: Payer: Medicaid Other | Admitting: Physical Therapy

## 2022-04-07 DIAGNOSIS — F985 Adult onset fluency disorder: Secondary | ICD-10-CM | POA: Diagnosis not present

## 2022-04-07 DIAGNOSIS — M6281 Muscle weakness (generalized): Secondary | ICD-10-CM

## 2022-04-07 DIAGNOSIS — R41841 Cognitive communication deficit: Secondary | ICD-10-CM

## 2022-04-07 DIAGNOSIS — R42 Dizziness and giddiness: Secondary | ICD-10-CM

## 2022-04-07 DIAGNOSIS — M542 Cervicalgia: Secondary | ICD-10-CM

## 2022-04-07 DIAGNOSIS — M5459 Other low back pain: Secondary | ICD-10-CM
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
## 2022-04-10 ENCOUNTER — Ambulatory Visit: Payer: Medicaid Other | Admitting: Physical Therapy

## 2022-04-10 ENCOUNTER — Ambulatory Visit: Payer: Medicaid Other

## 2022-04-10 ENCOUNTER — Encounter: Payer: Self-pay | Admitting: Physical Therapy

## 2022-04-10 DIAGNOSIS — F985 Adult onset fluency disorder: Secondary | ICD-10-CM

## 2022-04-10 DIAGNOSIS — M542 Cervicalgia: Secondary | ICD-10-CM

## 2022-04-10 DIAGNOSIS — M6281 Muscle weakness (generalized): Secondary | ICD-10-CM

## 2022-04-10 DIAGNOSIS — M5459 Other low back pain: Secondary | ICD-10-CM

## 2022-04-10 DIAGNOSIS — R41841 Cognitive communication deficit: Secondary | ICD-10-CM

## 2022-04-10 DIAGNOSIS — R42 Dizziness and giddiness: Secondary | ICD-10-CM
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
## 2022-04-11 NOTE — Therapy (Signed)
OUTPATIENT PHYSICAL THERAPY NOTE    Patient Name: Maria Bailey MRN: [id number] DOB:Jun 29, 1970, 52 y.o., female Today's Date: 04/11/2022  END OF SESSION:               Past Medical History:  Diagnosis Date   Back pain    Concussion    Dysesthesia    Face pain    GERD (gastroesophageal reflux disease)    History of stomach ulcers    Neck pain    Rheumatoid arthritis ([place])    Right arm pain    Right leg pain    Past Surgical History:  Procedure Laterality Date   c sections     x2   COLONOSCOPY     Patient Active Problem List   Diagnosis Date Noted   Hyperlipidemia 03/24/2022   Hyperglycemia 03/24/2022   Capsulitis of right shoulder 03/17/2022   Acquired stuttering 02/14/2022   Anxiety 02/14/2022   Dysphonia 12/31/2021   Concussion 07/23/2018    PCP: Maximiano Coss, NP  REFERRING PROVIDER: Rosemarie Ax, MD   REFERRING DIAG: S06.0X9A (ICD-10-CM) - Concussion with loss of consciousness, initial encounter  THERAPY DIAG:  No diagnosis found.  ONSET DATE: 12/08/2021  Rationale for Evaluation and Treatment: Rehabilitation  SUBJECTIVE:   SUBJECTIVE STATEMENT: Feel okay walking without the cane. Still having trouble with turns.    Pt accompanied by: self  PERTINENT HISTORY: RA, GERD, smoker, C-section x 2  PAIN:  Are you having pain? No   PRECAUTIONS: None  WEIGHT BEARING RESTRICTIONS: No  FALLS: Has patient fallen in last 6 months?  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   OBJECTIVE:     TODAY'S TREATMENT: 04/14/22 Activity Comments                        HOME EXERCISE PROGRAM Last updated: 04/10/22 Access Code: [email or web address] URL: [email or web address] Date: 04/10/2022 Prepared by: [facility]  Program Notes standing exercises should be done in corner or at counter for safety  Exercises - Romberg Stance on Foam Pad  - 1 x daily - 5 x weekly - 2 sets - 30 sec hold - Standing with Head Nod  - 1 x daily - 5 x weekly - 2 sets - 30 sec hold - Alternating Step Forward with Support  - 1 x daily - 5 x weekly - 2 sets - 10 reps - 180 Degree Pivot Turn with Single Point Cane  - 1 x daily - 5 x weekly - 3 sets - 30 sec hold - Standing Diagonal Chops with Medicine Ball  - 1 x daily - 5 x weekly - 2 sets - 5 reps   Below measures were taken at time of initial evaluation unless otherwise specified:   DIAGNOSTIC FINDINGS: CT head and cervical spine 12/08/21 Disc levels: Mild multilevel degenerative changes without  significant change. More pronounced facet degenerative changes at  multiple levels without significant change.  IMPRESSION:  1. No skull fracture or intracranial hemorrhage.  2. No cervical spine fracture or subluxation.  3. Stable cervical spine degenerative changes.  CT thoracic and lumbar spine 12/08/2021 IMPRESSION:  1. No fracture or subluxation of the thoracic or lumbar spine.  2. Mild multilevel degenerative changes of the thoracic and lumbar  spine.    COGNITION: Overall cognitive status: Within functional limits for tasks assessed   SENSATION: Not tested  POSTURE:  rounded shoulders and forward head  Cervical ROM:    Active AROM (deg) Eval * AROM 02/10/22 AROM 02/24/22  Flexion 25 full   Extension 50 full   Right lateral flexion     Left lateral flexion     Right rotation 45 45 59  Left rotation 40 40 47  (Blank rows = not tested) *very slow, guarded, tremorous movements, painful at eval  02/10/22 - smooth cervical movements  LUMBAR ROM:  NT today   Active   AROM  01/15/22 AROM 02/10/22  Flexion To mid shin same  Extension 100% limited 10 deg  Right lateral flexion 95% limited Hand to knee  Left lateral flexion 95% limited Hand to knee  Right rotation 95% limited 50%  Left rotation 95% limited  50%   (Blank rows = not tested)   UPPER EXTREMITY MMT:  MMT Right eval Left eval R  02/10/22  Shoulder flexion 4* 5 5*  Shoulder extension     Shoulder abduction 5 5   Shoulder adduction     Shoulder internal rotation 5 5   Shoulder external rotation 4+* 5 4+*  Elbow flexion 5* 5   Elbow extension 5* 5   Wrist flexion 5* 5   Wrist extension 5* 5   Grip strength Good* good    (Blank rows = not tested) *all movements very slow, tremorous on R, increased pain with movement at eval.  02/10/22 - slow movements in all planes   R SHOULDER ROM: limited in flexion and ER (unable to put hand behind head) 02/10/22. Tremor throughout motion.  LOWER EXTREMITY MMT:  4/5 bil LE strength, limited by pain and gaurding.    GAIT: Gait pattern: wide BOS Distance walked: 82' Assistive device utilized: None Level of assistance: Complete Independence Comments: visually slow, gait speed 0.68 m/s  FUNCTIONAL TESTS:  5 times sit to stand: 01/15/22- 28 sec with bil UE assist.  Dynamic Gait Index: TBA MCTSIB: 01/15/22- position 1: 30 sec, position 2: 30 sec increased sway, position 3: 30 sec increased sway position 4: 3 seconds    PATIENT SURVEYS:  DHI 60% impairment  VESTIBULAR ASSESSMENT:  GENERAL OBSERVATION: no apparent distress, resting neck tremor, tremor lifting R arm, very slow guarded movements, increased pain with all movements and resistance, reporting increasing headache and nausea with eye movements, tearful.     SYMPTOM BEHAVIOR:  Subjective history: reports initially unable to drive, room spinning, felt drunk, felt that way all weekend and didn't start to calm down until Tuesday.   Feels like may head is always shaking, like it wont stop.     Non-Vestibular symptoms: neck pain, headaches, and tremor  Type of dizziness: Imbalance (Disequilibrium), "Funny feeling in the head", and "Swimmyheaded"  Frequency: daily  Duration: variable  Aggravating factors: Induced by motion: driving and activity in general, Occurs when standing still , and when not concentrating on something, bright lights, sirens  Relieving factors: rest, slow movements, and avoid busy/distracting environments  Progression of symptoms: unchanged  OCULOMOTOR EXAM:  Ocular Alignment: normal  Ocular ROM: No Limitations  Spontaneous Nystagmus: absent  Gaze-Induced Nystagmus: absent  Smooth Pursuits: saccades  Saccades: dysmetria  Convergence/Divergence: 12 cm but  has to focus, prepare   Vestibular/Ocular Motor Screening  Headache (0-10) Dizziness (0-10) Nausea (0-10)  Fogginess (0-10) Baseline      0   2   0   0  Smooth Pursuits:     2   20 closed eyes  7   0 Saccades- Horizontal:     4   8   0   0 Saccades- Vertical:     8   0   0   0  Convergence ([place]) (Abnormal: [place] of convergence ? 6 cm from the tip of the nose) Measure 1:12 cm    2   5/6   0   0 Measure 2: NT                   Measure 3:  NT                    VOR- Horizontal:                 VOR- Vertical :                Visual Motion Sensitivity  (VOR Cancellation):                 VESTIBULAR - OCULAR REFLEX:   Slow VOR:   VOR Cancellation:   Head-Impulse Test:   Dynamic Visual Acuity: Static: impaired, having difficulty focusing, words blurring   POSITIONAL TESTING: NT    OTHOSTATICS: NT  FUNCTIONAL GAIT:  Gait speed 0.68 m/s, wide BOS, no AD     GOALS: Goals reviewed with patient? Yes  SHORT TERM GOALS: Target date: 01/23/2022   Patient will report compliance with initial HEP.  Baseline: Goal status: MET  2.  Patient will complete assessment of low back pain Baseline:   Goal status: MET  LONG TERM GOALS: Target date: 05/12/2022   Patient will be  independent with progressed HEP to improve outcomes and carryover.  Baseline:  Goal status: IN PROGRESS 03/31/22  2.  Patient will report 75% improvement in dizziness. Baseline: 20% 02/10/22; reports 80% 03/31/22 Goal status: MET 03/31/22  3.  Patient to demonstrate smooth step through pattern with gait with LRAD.  Baseline: step-to pattern; step through pattern with limited continuity today with walking stick 03/31/22 Goal status: IN PROGRESS 03/31/22  4.  Patient will demonstrate full pain free cervical ROM for safety with driving.  Baseline: no change in motion but smooth movement 02/10/22; pain-free 03/31/22 Goal status: MET 03/31/22  5.  Patient will demonstrate improved functional strength as demonstrated by 5x STS <14 seconds. Baseline: 01/15/2022 - 28 seconds with bil UE support, 02/10/22 - 24 seconds with bil Ue; 15 sec 03/31/22 Goal status: IN PROGRESS 03/31/22  6. Patient to demonstrate bed mobility and transfers with 0/10 dizziness.  Baseline: unable Goal status: MET 03/31/22  7. Patient to walk 800 feet during 6 minute walk test with LRAD.  Baseline: 471 ft Goal status: IN PROGRESS   8. Patient to report tolerance for 15-20 minutes of standing to complete household chores without symptoms limiting.  Baseline: reports unable  Goal status: IN PROGRESS     ASSESSMENT:  CLINICAL IMPRESSION: Patient arrived to session with report of remaining difficulty with turning activity from HEP. Reviewed this with cueing for foot placement in order to complete turn in as little steps possible. Patient demonstrated good stability with backwards walking, less confidence and more hesitation with walking with EC. Bending habituation  revealed more sensitivity to the R upward diagonal, requiring breaks to resolve symptoms. Consolidated HEP for max benefit as patient now reports ease with several activities. Patient tolerated session well and without complaints upon leaving.    OBJECTIVE IMPAIRMENTS:  decreased activity tolerance, decreased endurance, decreased mobility, decreased ROM, decreased strength, dizziness, impaired perceived functional ability, increased muscle spasms, impaired UE functional use, impaired vision/preception, postural dysfunction, and pain.   ACTIVITY LIMITATIONS: carrying, lifting, bending, sitting, standing, stairs, transfers, dressing, reach over head, and locomotion level  PARTICIPATION LIMITATIONS: meal prep, cleaning, laundry, driving, shopping, community activity, and occupation  PERSONAL FACTORS: Fitness and 3+ comorbidities: concussion, RA, GERD, smoker, C-section x 2  are also affecting patient's functional outcome.   REHAB POTENTIAL: Good  CLINICAL DECISION MAKING: Evolving/moderate complexity  EVALUATION COMPLEXITY: Moderate   PLAN:  PT FREQUENCY: 1-2x/week  PT DURATION: 8 weeks  PLANNED INTERVENTIONS: Therapeutic exercises, Therapeutic activity, Neuromuscular re-education, Balance training, Gait training, Patient/Family education, Self Care, Joint mobilization, Stair training, Vestibular training, Canalith repositioning, DME instructions, Aquatic Therapy, Dry Needling, Electrical stimulation, Spinal mobilization, Cryotherapy, Moist heat, Taping, Traction, Ultrasound, Manual therapy, and Re-evaluation  PLAN FOR NEXT SESSION:   habituation and gaze stability activities   Janene Harvey, PT, DPT 04/11/22 9:24 AM  [facility] [address], [place], [place] [address] Phone # [phone] Fax # [phone]

## 2022-04-14 ENCOUNTER — Encounter: Payer: Self-pay | Admitting: Physical Therapy

## 2022-04-14 ENCOUNTER — Ambulatory Visit: Payer: Medicaid Other

## 2022-04-14 ENCOUNTER — Ambulatory Visit: Payer: Medicaid Other | Admitting: Physical Therapy

## 2022-04-14 DIAGNOSIS — M542 Cervicalgia: Secondary | ICD-10-CM

## 2022-04-14 DIAGNOSIS — M5459 Other low back pain: Secondary | ICD-10-CM

## 2022-04-14 DIAGNOSIS — R42 Dizziness and giddiness: Secondary | ICD-10-CM

## 2022-04-14 DIAGNOSIS — M6281 Muscle weakness (generalized): Secondary | ICD-10-CM

## 2022-04-14 DIAGNOSIS — F985 Adult onset fluency disorder: Secondary | ICD-10-CM

## 2022-04-14 DIAGNOSIS — R41841 Cognitive communication deficit: Secondary | ICD-10-CM
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
## 2022-04-17 ENCOUNTER — Ambulatory Visit: Payer: Medicaid Other | Admitting: Physical Therapy

## 2022-04-17 ENCOUNTER — Ambulatory Visit: Payer: Medicaid Other
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
## 2022-04-21 ENCOUNTER — Encounter: Payer: Self-pay | Admitting: Family Medicine

## 2022-04-21 ENCOUNTER — Ambulatory Visit: Payer: Medicaid Other | Admitting: Physical Therapy

## 2022-04-21 ENCOUNTER — Ambulatory Visit (INDEPENDENT_AMBULATORY_CARE_PROVIDER_SITE_OTHER): Payer: Medicaid Other | Admitting: Family Medicine

## 2022-04-21 ENCOUNTER — Ambulatory Visit: Payer: Medicaid Other

## 2022-04-21 ENCOUNTER — Encounter: Payer: Self-pay | Admitting: Physical Therapy

## 2022-04-21 VITALS — BP 120/84 | Ht 62.0 in | Wt 193.0 lb

## 2022-04-21 DIAGNOSIS — M542 Cervicalgia: Secondary | ICD-10-CM

## 2022-04-21 DIAGNOSIS — S060X0D Concussion without loss of consciousness, subsequent encounter: Secondary | ICD-10-CM | POA: Diagnosis not present

## 2022-04-21 DIAGNOSIS — R42 Dizziness and giddiness: Secondary | ICD-10-CM

## 2022-04-21 DIAGNOSIS — F4310 Post-traumatic stress disorder, unspecified: Secondary | ICD-10-CM | POA: Diagnosis not present

## 2022-04-21 DIAGNOSIS — F985 Adult onset fluency disorder: Secondary | ICD-10-CM | POA: Diagnosis not present

## 2022-04-21 DIAGNOSIS — M5459 Other low back pain: Secondary | ICD-10-CM

## 2022-04-21 DIAGNOSIS — M6281 Muscle weakness (generalized): Secondary | ICD-10-CM
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
## 2022-04-21 NOTE — Patient Instructions (Signed)
Good to see you We have made a referral to the psychologist  I'm going to hold on the other medicine now due to medication interactions. We may have to stop the trazodone if the symptoms continue during therapy  Please send me a message in MyChart with any questions or updates.  Please see me back in 4-6 weeks.   --Dr. Raeford Razor
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
## 2022-04-24 ENCOUNTER — Ambulatory Visit: Payer: Medicaid Other | Admitting: Physical Therapy

## 2022-04-24 ENCOUNTER — Encounter: Payer: Self-pay | Admitting: Physical Therapy

## 2022-04-24 ENCOUNTER — Ambulatory Visit: Payer: Medicaid Other

## 2022-04-24 DIAGNOSIS — M6281 Muscle weakness (generalized): Secondary | ICD-10-CM

## 2022-04-24 DIAGNOSIS — F985 Adult onset fluency disorder: Secondary | ICD-10-CM | POA: Diagnosis not present

## 2022-04-24 DIAGNOSIS — M542 Cervicalgia: Secondary | ICD-10-CM

## 2022-04-24 DIAGNOSIS — M5459 Other low back pain: Secondary | ICD-10-CM

## 2022-04-24 DIAGNOSIS — R42 Dizziness and giddiness: Secondary | ICD-10-CM
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
## 2022-05-05 DIAGNOSIS — Z419 Encounter for procedure for purposes other than remedying health state, unspecified: Secondary | ICD-10-CM | POA: Diagnosis not present

## 2022-05-06 ENCOUNTER — Encounter: Payer: Self-pay | Admitting: Family Medicine

## 2022-05-07 ENCOUNTER — Encounter: Payer: Self-pay | Admitting: Family Medicine

## 2022-05-13 ENCOUNTER — Other Ambulatory Visit (INDEPENDENT_AMBULATORY_CARE_PROVIDER_SITE_OTHER): Payer: Medicaid Other

## 2022-05-13 DIAGNOSIS — E785 Hyperlipidemia, unspecified: Secondary | ICD-10-CM

## 2022-05-13 DIAGNOSIS — D72829 Elevated white blood cell count, unspecified: Secondary | ICD-10-CM

## 2022-05-14 LAB — CBC WITH DIFFERENTIAL/PLATELET
Basophils Absolute: 0.1 10*3/uL (ref 0.0–0.1)
Basophils Relative: 0.7 % (ref 0.0–3.0)
Eosinophils Absolute: 0.1 10*3/uL (ref 0.0–0.7)
Eosinophils Relative: 1.4 % (ref 0.0–5.0)
HCT: 39.4 % (ref 36.0–46.0)
Hemoglobin: 13.2 g/dL (ref 12.0–15.0)
Lymphocytes Relative: 20.9 % (ref 12.0–46.0)
Lymphs Abs: 1.8 10*3/uL (ref 0.7–4.0)
MCHC: 33.5 g/dL (ref 30.0–36.0)
MCV: 89.7 fl (ref 78.0–100.0)
Monocytes Absolute: 0.3 10*3/uL (ref 0.1–1.0)
Monocytes Relative: 3.9 % (ref 3.0–12.0)
Neutro Abs: 6.2 10*3/uL (ref 1.4–7.7)
Neutrophils Relative %: 73.1 % (ref 43.0–77.0)
Platelets: 214 10*3/uL (ref 150.0–400.0)
RBC: 4.39 Mil/uL (ref 3.87–5.11)
RDW: 14.1 % (ref 11.5–15.5)
WBC: 8.5 10*3/uL (ref 4.0–10.5)

## 2022-05-14 LAB — COMPREHENSIVE METABOLIC PANEL
ALT: 11 U/L (ref 0–35)
AST: 12 U/L (ref 0–37)
Albumin: 4.1 g/dL (ref 3.5–5.2)
Alkaline Phosphatase: 64 U/L (ref 39–117)
BUN: 18 mg/dL (ref 6–23)
CO2: 29 mEq/L (ref 19–32)
Calcium: 9 mg/dL (ref 8.4–10.5)
Chloride: 104 mEq/L (ref 96–112)
Creatinine, Ser: 1.05 mg/dL (ref 0.40–1.20)
GFR: 61.67 mL/min (ref >60.00)
Glucose, Bld: 173 mg/dL — ABNORMAL HIGH (ref 70–99)
Potassium: 4.5 mEq/L (ref 3.5–5.1)
Sodium: 140 mEq/L (ref 135–145)
Total Bilirubin: 0.3 mg/dL (ref 0.2–1.2)
Total Protein: 6.2 g/dL (ref 6.0–8.3)

## 2022-05-15 ENCOUNTER — Other Ambulatory Visit: Payer: Self-pay | Admitting: Family Medicine

## 2022-05-15 DIAGNOSIS — S060X0D Concussion without loss of consciousness, subsequent encounter: Secondary | ICD-10-CM

## 2022-05-15 DIAGNOSIS — F419 Anxiety disorder, unspecified: Secondary | ICD-10-CM

## 2022-05-15 DIAGNOSIS — F4310 Post-traumatic stress disorder, unspecified: Secondary | ICD-10-CM

## 2022-05-15 NOTE — Addendum Note (Signed)
Addended bySilvio Pate on: 05/15/2022 03:33 PM   Modules accepted: Orders

## 2022-05-15 NOTE — Telephone Encounter (Signed)
Patient called to see if Ladona Ridgel can refill her  nortriptyline (PAMELOR) 25 MG capsule as she just finished the last of the prescription or something similar. Pt said Dr. Jordan Likes told her he could not refill it again but she is concerned with how her body would react without it. Please send to [facility] [address]. Please call to advise if possible.

## 2022-05-15 NOTE — Telephone Encounter (Signed)
Okay to fill?   Established as a new patient in January.

## 2022-05-16 NOTE — Addendum Note (Signed)
Addended by: Hyman Hopes B on: 05/16/2022 11:45 AM   Modules accepted: Orders

## 2022-05-16 NOTE — Addendum Note (Signed)
Addended bySilvio Pate on: 05/16/2022 11:29 AM   Modules accepted: Orders

## 2022-05-16 NOTE — Telephone Encounter (Signed)
Spoke with patient and made her aware this needs to be managed by psychiatry. She is only seeing counseling (this is where she was referred) she is okay seeing psychiatry for medication management. Okay to refer? Pended.

## 2022-05-19 ENCOUNTER — Encounter: Payer: Self-pay | Admitting: *Deleted

## 2022-05-20 ENCOUNTER — Encounter: Payer: Self-pay | Admitting: Family Medicine

## 2022-05-26 ENCOUNTER — Encounter: Payer: Self-pay | Admitting: Family Medicine

## 2022-05-26 ENCOUNTER — Ambulatory Visit (INDEPENDENT_AMBULATORY_CARE_PROVIDER_SITE_OTHER): Payer: Medicaid Other | Admitting: Family Medicine

## 2022-05-26 VITALS — BP 128/76 | Ht 62.0 in | Wt 193.0 lb

## 2022-05-26 DIAGNOSIS — F4310 Post-traumatic stress disorder, unspecified: Secondary | ICD-10-CM

## 2022-05-26 DIAGNOSIS — S060X0D Concussion without loss of consciousness, subsequent encounter: Secondary | ICD-10-CM

## 2022-05-26 MED ORDER — SERTRALINE HCL 25 MG PO TABS: 25.0000 mg | ORAL_TABLET | Freq: Every day | ORAL | 1 refills | Status: DC

## 2022-05-26 NOTE — Patient Instructions (Signed)
Good to see you Please start the zoloft. Please continue with the counseling   Please send me a message in MyChart with any questions or updates.  Please see me back in 3 weeks.   --Dr. Jordan Likes
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
## 2022-05-26 NOTE — Assessment & Plan Note (Signed)
Acute on chronic in nature.  Continues to have trouble in social situations as well as word finding. -Counseled on home exercise therapy and supportive care. -Initiate Zoloft.  Can titrate as needed -Continue follow-up with psychology

## 2022-06-16 ENCOUNTER — Ambulatory Visit: Payer: Medicaid Other | Admitting: Family Medicine

## 2022-06-23 ENCOUNTER — Ambulatory Visit (INDEPENDENT_AMBULATORY_CARE_PROVIDER_SITE_OTHER): Payer: Medicaid Other | Admitting: Family Medicine

## 2022-06-23 VITALS — BP 110/84 | Ht 62.0 in | Wt 185.0 lb

## 2022-06-23 DIAGNOSIS — S060X0D Concussion without loss of consciousness, subsequent encounter: Secondary | ICD-10-CM | POA: Diagnosis not present

## 2022-06-23 DIAGNOSIS — F4310 Post-traumatic stress disorder, unspecified: Secondary | ICD-10-CM

## 2022-06-23 NOTE — Patient Instructions (Signed)
Good to see you Please wean off the zoloft  We have placed a referral to psychiatry   Please send me a message in MyChart with any questions or updates.  Please see the clinic back in 6 weeks.   --Dr. Jordan Likes

## 2022-06-23 NOTE — Assessment & Plan Note (Signed)
Acutely worsening while being started on Zoloft.  Having word finding as well as emotional outburst.  Having trouble with riding in a vehicle.  Did seem to tolerate the nortriptyline better. - Referral to psychiatry
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
## 2022-07-09 ENCOUNTER — Encounter (HOSPITAL_COMMUNITY): Payer: Self-pay | Admitting: Psychiatry

## 2022-07-09 ENCOUNTER — Ambulatory Visit (HOSPITAL_BASED_OUTPATIENT_CLINIC_OR_DEPARTMENT_OTHER): Payer: Medicaid Other | Admitting: Psychiatry

## 2022-07-09 VITALS — Wt 200.0 lb

## 2022-07-09 DIAGNOSIS — F4310 Post-traumatic stress disorder, unspecified: Secondary | ICD-10-CM

## 2022-07-09 DIAGNOSIS — F418 Other specified anxiety disorders: Secondary | ICD-10-CM | POA: Diagnosis not present

## 2022-07-09 MED ORDER — TRAZODONE HCL 50 MG PO TABS: 50.0000 mg | ORAL_TABLET | Freq: Every day | ORAL | 0 refills | Status: DC
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
## 2022-07-22 ENCOUNTER — Encounter: Payer: Self-pay | Admitting: Family Medicine

## 2022-07-22 ENCOUNTER — Ambulatory Visit (INDEPENDENT_AMBULATORY_CARE_PROVIDER_SITE_OTHER): Payer: Medicaid Other | Admitting: Family Medicine

## 2022-07-22 VITALS — BP 130/71 | HR 82 | Ht 62.0 in | Wt 207.0 lb

## 2022-07-22 DIAGNOSIS — Z1231 Encounter for screening mammogram for malignant neoplasm of breast: Secondary | ICD-10-CM | POA: Diagnosis not present

## 2022-07-22 DIAGNOSIS — R600 Localized edema: Secondary | ICD-10-CM | POA: Diagnosis not present

## 2022-07-22 LAB — COMPREHENSIVE METABOLIC PANEL
ALT: 9 U/L (ref 0–35)
AST: 13 U/L (ref 0–37)
Albumin: 4.2 g/dL (ref 3.5–5.2)
Alkaline Phosphatase: 63 U/L (ref 39–117)
BUN: 20 mg/dL (ref 6–23)
CO2: 25 mEq/L (ref 19–32)
Calcium: 9.6 mg/dL (ref 8.4–10.5)
Chloride: 105 mEq/L (ref 96–112)
Creatinine, Ser: 1.05 mg/dL (ref 0.40–1.20)
GFR: 61.59 mL/min (ref >60.00)
Glucose, Bld: 92 mg/dL (ref 70–99)
Potassium: 4.7 mEq/L (ref 3.5–5.1)
Sodium: 139 mEq/L (ref 135–145)
Total Bilirubin: 0.7 mg/dL (ref 0.2–1.2)
Total Protein: 6.9 g/dL (ref 6.0–8.3)

## 2022-07-22 LAB — CBC WITH DIFFERENTIAL/PLATELET
Basophils Absolute: 0 10*3/uL (ref 0.0–0.1)
Basophils Relative: 0.4 % (ref 0.0–3.0)
Eosinophils Absolute: 0.1 10*3/uL (ref 0.0–0.7)
Eosinophils Relative: 1.1 % (ref 0.0–5.0)
HCT: 41.1 % (ref 36.0–46.0)
Hemoglobin: 13.3 g/dL (ref 12.0–15.0)
Lymphocytes Relative: 19 % (ref 12.0–46.0)
Lymphs Abs: 1.7 10*3/uL (ref 0.7–4.0)
MCHC: 32.4 g/dL (ref 30.0–36.0)
MCV: 89.4 fl (ref 78.0–100.0)
Monocytes Absolute: 0.3 10*3/uL (ref 0.1–1.0)
Monocytes Relative: 3.7 % (ref 3.0–12.0)
Neutro Abs: 6.8 10*3/uL (ref 1.4–7.7)
Neutrophils Relative %: 75.8 % (ref 43.0–77.0)
Platelets: 219 10*3/uL (ref 150.0–400.0)
RBC: 4.6 Mil/uL (ref 3.87–5.11)
RDW: 14.3 % (ref 11.5–15.5)
WBC: 9 10*3/uL (ref 4.0–10.5)

## 2022-07-22 LAB — BRAIN NATRIURETIC PEPTIDE: Pro B Natriuretic peptide (BNP): 38 pg/mL (ref 0.0–100.0)

## 2022-07-22 NOTE — Patient Instructions (Addendum)
Stop the Trazodone to see if this is contributing to the swelling.  Labs today to ensure no other causes.  Continue with healthy diet, regular exercise, try compression socks and elevating your legs when seated.  Keep psychiatry appointment for next week to adjust medications.  If swelling does not begin going down over the next week, please follow-up.

## 2022-07-22 NOTE — Progress Notes (Signed)
   Acute Office Visit  Subjective:     Patient ID: Maria Bailey, female    DOB: December 17, 1970, 52 y.o.   MRN: [id number]  Chief Complaint  Patient presents with   Edema    HPI Patient is in today for edema.   Discussed the use of AI scribe software for clinical note transcription with the patient, who gave verbal consent to proceed.  History of Present Illness   The patient presents with bilateral lower extremity swelling, worse on the right, and right arm swelling. They report a sensation of 'pins and needles' in their toes and the ball of their foot, and discomfort when moving their foot due to the swelling. The swelling is described as 'tight' and 'throbby.' They deny any changes in diet or activity level, and no other symptoms are reported. The patient notes that the swelling began after starting Trazodone, prescribed by Dr. Lolly Mustache at the beginning of the month. They recall a similar reaction to Trazodone in the past. The patient also mentions a history of PTSD and previous treatment with Nortriptyline, which they found helpful.           ROS All review of systems negative except what is listed in the HPI      Objective:    BP 130/71   Pulse 82   Ht 5\' 2"  (1.575 m)   Wt 207 lb (93.9 kg)   SpO2 95%   BMI 37.86 kg/m    Physical Exam Vitals reviewed.  Constitutional:      Appearance: Normal appearance.  Cardiovascular:     Rate and Rhythm: Normal rate and regular rhythm.     Pulses: Normal pulses.     Heart sounds: Normal heart sounds.  Pulmonary:     Effort: Pulmonary effort is normal.     Breath sounds: Normal breath sounds.  Musculoskeletal:     Comments: 1-2 + BLE edema; negative Homans sign bilaterally   Skin:    General: Skin is warm and dry.  Neurological:     Mental Status: She is alert and oriented to person, place, and time.  Psychiatric:        Mood and Affect: Mood normal.        Behavior: Behavior normal.        Thought Content: Thought  content normal.        Judgment: Judgment normal.     No results found for any visits on 07/22/22.      Assessment & Plan:   Problem List Items Addressed This Visit   None Visit Diagnoses     Encounter for screening mammogram for malignant neoplasm of breast       Relevant Orders   MM 3D SCREENING MAMMOGRAM BILATERAL BREAST   Bilateral lower extremity edema     Stop the Trazodone to see if this is contributing to the swelling.  Labs today to ensure no other causes.  Continue with healthy diet, regular exercise, try compression socks and elevating your legs when seated.  Keep psychiatry appointment for next week to adjust medications.  If swelling does not begin going down over the next week, please follow-up.    Relevant Orders   CBC with Differential/Platelet   Comprehensive metabolic panel   B Nat Peptide       No orders of the defined types were placed in this encounter.   Return if symptoms worsen or fail to improve.  Clayborne Dana, NP

## 2022-07-28 ENCOUNTER — Telehealth (HOSPITAL_COMMUNITY): Payer: Self-pay

## 2022-07-28 ENCOUNTER — Encounter (HOSPITAL_BASED_OUTPATIENT_CLINIC_OR_DEPARTMENT_OTHER): Payer: Self-pay

## 2022-07-28 ENCOUNTER — Encounter: Payer: Self-pay | Admitting: Family Medicine

## 2022-07-28 ENCOUNTER — Ambulatory Visit (HOSPITAL_BASED_OUTPATIENT_CLINIC_OR_DEPARTMENT_OTHER)
Admission: RE | Admit: 2022-07-28 | Discharge: 2022-07-28 | Disposition: A | Discharge status: Home / Self Care | Payer: Medicaid Other | Source: Ambulatory Visit | Attending: Family Medicine | Admitting: Family Medicine

## 2022-07-28 DIAGNOSIS — Z1231 Encounter for screening mammogram for malignant neoplasm of breast: Secondary | ICD-10-CM | POA: Insufficient documentation

## 2022-07-28 NOTE — Telephone Encounter (Signed)
Patient called and states that the Trazodone caused her body to swell up and her to swell up, she went to the doctor on 6/18 and was advised to stop taking it. She said she used to take Nortriptyline 25 mg and it worked well for her PTSD and sleep. She is wondering if she can go back to that. Please review and advise, thank you

## 2022-07-29 ENCOUNTER — Ambulatory Visit (HOSPITAL_BASED_OUTPATIENT_CLINIC_OR_DEPARTMENT_OTHER)
Admission: RE | Admit: 2022-07-29 | Discharge: 2022-07-29 | Disposition: A | Discharge status: Home / Self Care | Payer: Medicaid Other | Source: Ambulatory Visit | Attending: Family Medicine | Admitting: Family Medicine

## 2022-07-29 ENCOUNTER — Ambulatory Visit (INDEPENDENT_AMBULATORY_CARE_PROVIDER_SITE_OTHER): Payer: Medicaid Other | Admitting: Family Medicine

## 2022-07-29 ENCOUNTER — Encounter: Payer: Self-pay | Admitting: Family Medicine

## 2022-07-29 VITALS — BP 133/59 | HR 80 | Ht 62.0 in | Wt 209.0 lb

## 2022-07-29 DIAGNOSIS — M79671 Pain in right foot: Secondary | ICD-10-CM

## 2022-07-29 DIAGNOSIS — M7501 Adhesive capsulitis of right shoulder: Secondary | ICD-10-CM | POA: Diagnosis not present
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
## 2022-07-30 ENCOUNTER — Telehealth (HOSPITAL_COMMUNITY): Payer: Medicaid Other | Admitting: Psychiatry

## 2022-07-31 ENCOUNTER — Other Ambulatory Visit (HOSPITAL_COMMUNITY): Payer: Self-pay

## 2022-07-31 MED ORDER — NORTRIPTYLINE HCL 25 MG PO CAPS: 25.0000 mg | ORAL_CAPSULE | Freq: Every day | ORAL | 0 refills | Status: DC

## 2022-07-31 NOTE — Telephone Encounter (Signed)
I saw her once.  She mentioned trazodone helped in the past but if it is causing side effects then she can start nortriptyline 25 mg at bedtime.  I saw Zoloft also discontinued by primary care.  Please verify with the patient and if she agree please call 25 mg nortriptyline to her local pharmacy.

## 2022-08-04 ENCOUNTER — Telehealth (HOSPITAL_COMMUNITY): Payer: Self-pay | Admitting: Licensed Clinical Social Worker

## 2022-08-04 NOTE — Telephone Encounter (Signed)
Maria Bailey was referred for therapy by her psychiatrist.  Upon reviewing Dr. Sheela Stack recent note ahead of assessment tomorrow, clinician determined that Maria Bailey will require a therapist specialized in trauma therapy. Clinician Maria Bailey by phone at 12:08pm and informed her of inability for this provider to offer appropriate treatment.  Clinician offered referrals for providers/agencies that specialize in this modality, including [facility], [facility], and [facility]. Maria Bailey was understanding, receptive to these referrals, and reported that she would contact them today to see about availability for setting up appointments.  Clinician informed front desk of assessment cancellation.     Noralee Stain, LCSW, LCAS 08/04/22

## 2022-08-05 ENCOUNTER — Ambulatory Visit (HOSPITAL_COMMUNITY): Payer: Medicaid Other | Admitting: Licensed Clinical Social Worker

## 2022-08-11 ENCOUNTER — Ambulatory Visit (INDEPENDENT_AMBULATORY_CARE_PROVIDER_SITE_OTHER): Payer: Medicaid Other | Admitting: Family Medicine

## 2022-08-11 ENCOUNTER — Ambulatory Visit: Admission: RE | Admit: 2022-08-11 | Payer: Medicaid Other | Source: Ambulatory Visit

## 2022-08-11 VITALS — BP 128/80 | Ht 62.0 in | Wt 208.0 lb

## 2022-08-11 DIAGNOSIS — S060X0D Concussion without loss of consciousness, subsequent encounter: Secondary | ICD-10-CM | POA: Diagnosis not present

## 2022-08-11 DIAGNOSIS — M25511 Pain in right shoulder: Secondary | ICD-10-CM

## 2022-08-11 DIAGNOSIS — G8929 Other chronic pain: Secondary | ICD-10-CM

## 2022-08-11 NOTE — Patient Instructions (Signed)
Continue the nortriptyline and therapy appointments. We will go ahead with an MRI of your right shoulder - I'll call you with the results and next steps after this. Ibuprofen or aleve if needed in addition to your tylenol.
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
## 2022-08-12 ENCOUNTER — Encounter: Payer: Self-pay | Admitting: Family Medicine

## 2022-08-19 ENCOUNTER — Encounter: Payer: Self-pay | Admitting: *Deleted

## 2022-08-22 ENCOUNTER — Other Ambulatory Visit (HOSPITAL_COMMUNITY): Payer: Self-pay | Admitting: Psychiatry

## 2022-08-24 ENCOUNTER — Ambulatory Visit
Admission: RE | Admit: 2022-08-24 | Discharge: 2022-08-24 | Disposition: A | Discharge status: Home / Self Care | Payer: Medicaid Other | Source: Ambulatory Visit | Attending: Family Medicine | Admitting: Family Medicine

## 2022-08-24 DIAGNOSIS — G8929 Other chronic pain: Secondary | ICD-10-CM

## 2022-08-28 ENCOUNTER — Other Ambulatory Visit (HOSPITAL_COMMUNITY): Payer: Self-pay

## 2022-08-28 MED ORDER — METHOCARBAMOL 500 MG PO TABS: 500.0000 mg | ORAL_TABLET | Freq: Two times a day (BID) | ORAL | 0 refills | Status: DC

## 2022-08-29 ENCOUNTER — Other Ambulatory Visit (HOSPITAL_COMMUNITY): Payer: Self-pay

## 2022-08-29 MED ORDER — NORTRIPTYLINE HCL 25 MG PO CAPS: 25.0000 mg | ORAL_CAPSULE | Freq: Every day | ORAL | 0 refills | Status: DC

## 2022-09-03 ENCOUNTER — Encounter (HOSPITAL_COMMUNITY): Payer: Self-pay | Admitting: Psychiatry

## 2022-09-03 ENCOUNTER — Encounter (INDEPENDENT_AMBULATORY_CARE_PROVIDER_SITE_OTHER): Payer: Self-pay

## 2022-09-03 ENCOUNTER — Encounter: Payer: Self-pay | Admitting: Family Medicine

## 2022-09-03 ENCOUNTER — Telehealth (HOSPITAL_BASED_OUTPATIENT_CLINIC_OR_DEPARTMENT_OTHER): Payer: Medicaid Other | Admitting: Psychiatry

## 2022-09-03 VITALS — Wt 208.0 lb

## 2022-09-03 DIAGNOSIS — R413 Other amnesia: Secondary | ICD-10-CM

## 2022-09-03 DIAGNOSIS — F418 Other specified anxiety disorders: Secondary | ICD-10-CM

## 2022-09-03 DIAGNOSIS — F4310 Post-traumatic stress disorder, unspecified: Secondary | ICD-10-CM | POA: Diagnosis not present

## 2022-09-03 MED ORDER — NORTRIPTYLINE HCL 50 MG PO CAPS
50.0000 mg | ORAL_CAPSULE | Freq: Every day | ORAL | 1 refills | Status: DC
Start: 2022-09-03 — End: 2022-10-14
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
## 2022-09-18 ENCOUNTER — Other Ambulatory Visit: Payer: Self-pay

## 2022-09-18 ENCOUNTER — Encounter (HOSPITAL_BASED_OUTPATIENT_CLINIC_OR_DEPARTMENT_OTHER): Payer: Self-pay | Admitting: Orthopaedic Surgery

## 2022-09-19 NOTE — Progress Notes (Unsigned)
   Established Patient Office Visit  Subjective   Patient ID: Maria Bailey, female    DOB: 1970-12-17  Age: 52 y.o. MRN: [id number]  No chief complaint on file.   HPI   Patient is here for routine 72-month follow-up and chronic disease management. ***  Mood follow-up: - Diagnosis: anxiety, PTSD - Treatment:  - Medication side effects:  - SI/HI:  - Update:   Hyperlipidemia: - medications: Omega-3 *** - compliance: *** - medication SEs: *** The 10-year ASCVD risk score (Arnett DK, et al., 2019) is: 7.6%   Values used to calculate the score:     Age: 17 years     Sex: Female     Is Non-Hispanic African American: No     Diabetic: Yes     Tobacco smoker: Yes     Systolic Blood Pressure: 128 mmHg     Is BP treated: No     HDL Cholesterol: 52.5 mg/dL     Total Cholesterol: 202 mg/dL   Diabetes: - Checking glucose at home: *** - Medications: *** - Compliance: *** - Diet: *** - Exercise: *** - Eye exam: *** - Foot exam: *** - Microalbumin: *** - Denies symptoms of hypoglycemia, polyuria, polydipsia, numbness extremities, foot ulcers/trauma, wounds that are not healing, medication side effects  Lab Results  Component Value Date   HGBA1C 6.5 03/24/2022        {History (Optional):23778}  ROS All review of systems negative except what is listed in the HPI    Objective:     LMP 10/04/2021 (Approximate)  {Vitals History (Optional):23777}  Physical Exam   No results found for any visits on 09/22/22.  {Labs (Optional):23779}  The 10-year ASCVD risk score (Arnett DK, et al., 2019) is: 7.6%    Assessment & Plan:   Problem List Items Addressed This Visit   None   No follow-ups on file.    Clayborne Dana, NP

## 2022-09-22 ENCOUNTER — Ambulatory Visit: Payer: Medicaid Other | Admitting: Family Medicine

## 2022-09-22 ENCOUNTER — Encounter: Payer: Self-pay | Admitting: Family Medicine

## 2022-09-22 VITALS — BP 130/87 | HR 94 | Ht 62.0 in | Wt 215.0 lb

## 2022-09-22 DIAGNOSIS — R413 Other amnesia: Secondary | ICD-10-CM

## 2022-09-22 DIAGNOSIS — F4310 Post-traumatic stress disorder, unspecified: Secondary | ICD-10-CM

## 2022-09-22 DIAGNOSIS — R739 Hyperglycemia, unspecified: Secondary | ICD-10-CM | POA: Diagnosis not present

## 2022-09-22 DIAGNOSIS — F419 Anxiety disorder, unspecified: Secondary | ICD-10-CM | POA: Diagnosis not present

## 2022-09-22 DIAGNOSIS — E785 Hyperlipidemia, unspecified: Secondary | ICD-10-CM | POA: Diagnosis not present

## 2022-09-22 LAB — CBC WITH DIFFERENTIAL/PLATELET
Basophils Absolute: 0 10*3/uL (ref 0.0–0.1)
Basophils Relative: 0.5 % (ref 0.0–3.0)
Eosinophils Absolute: 0.1 10*3/uL (ref 0.0–0.7)
Eosinophils Relative: 1.3 % (ref 0.0–5.0)
HCT: 42.2 % (ref 36.0–46.0)
Hemoglobin: 13.4 g/dL (ref 12.0–15.0)
Lymphocytes Relative: 19.2 % (ref 12.0–46.0)
Lymphs Abs: 1.8 10*3/uL (ref 0.7–4.0)
MCHC: 31.8 g/dL (ref 30.0–36.0)
MCV: 88.8 fl (ref 78.0–100.0)
Monocytes Absolute: 0.4 10*3/uL (ref 0.1–1.0)
Monocytes Relative: 4.8 % (ref 3.0–12.0)
Neutro Abs: 6.8 10*3/uL (ref 1.4–7.7)
Neutrophils Relative %: 74.2 % (ref 43.0–77.0)
Platelets: 234 10*3/uL (ref 150.0–400.0)
RBC: 4.75 Mil/uL (ref 3.87–5.11)
RDW: 14.6 % (ref 11.5–15.5)
WBC: 9.2 10*3/uL (ref 4.0–10.5)

## 2022-09-22 LAB — TSH: TSH: 2.49 u[IU]/mL (ref 0.35–5.50)

## 2022-09-22 LAB — HEMOGLOBIN A1C: Hgb A1c MFr Bld: 7.1 % — ABNORMAL HIGH (ref 4.6–6.5)

## 2022-09-22 NOTE — Assessment & Plan Note (Signed)
Lifestyle factors for lowering cholesterol include: Diet therapy - heart-healthy diet rich in fruits, veggies, fiber-rich whole grains, lean meats, chicken, fish (at least twice a week), fat-free or 1% dairy products; foods low in saturated/trans fats, cholesterol, sodium, and sugar. Mediterranean diet has shown to be very heart healthy. Regular exercise - recommend at least 30 minutes a day, 5 times per week Weight management  Labs today

## 2022-09-22 NOTE — Assessment & Plan Note (Signed)
Lab Results  Component Value Date   HGBA1C 6.5 03/24/2022   Updating labs today. No current medications for hyperglycemia.  Discussed heart healthy, low-carb diet and regular exercise.

## 2022-09-22 NOTE — H&P (Signed)
PREOPERATIVE H&P  Chief Complaint: RIGHT SHOULDER ROTATOR CUFF REPAIR  HPI: Maria Bailey is a 52 y.o. female who is scheduled for, Procedure(s): SHOULDER ARTHROSCOPY WITH ROTATOR CUFF REPAIR, SUBACROMIAL DECOMPRESSION, DISTAL CLAVICLECTOMY AND BICEPS TENODESIS DISTAL CLAVICLECTOMY BICEPS TENODESIS.   Patient has a past medical history significant for GERD, RA.   Patient has had shoulder pain for quite some time. She has received temporary relief with injections. She continues to have pain with all activities including sleep. She has difficulty working secondary to shoulder pain.   Symptoms are rated as moderate to severe, and have been worsening.  This is significantly impairing activities of daily living.    Please see clinic note for further details on this patient's care.    She has elected for surgical management.   Past Medical History:  Diagnosis Date   Back pain    Concussion    Dysesthesia    Face pain    GERD (gastroesophageal reflux disease)    History of stomach ulcers    Neck pain    Rheumatoid arthritis (HCC)    Right arm pain    Right leg pain    Past Surgical History:  Procedure Laterality Date   c sections     x2   COLONOSCOPY     Social History   Socioeconomic History   Marital status: Married    Spouse name: Not on file   Number of children: Not on file   Years of education: Not on file   Highest education level: Associate degree: academic program  Occupational History   Not on file  Tobacco Use   Smoking status: Every Day    Current packs/day: 1.00    Types: Cigarettes   Smokeless tobacco: Never  Vaping Use   Vaping status: Never Used  Substance and Sexual Activity   Alcohol use: Not Currently    Comment: occ   Drug use: No   Sexual activity: Not on file  Other Topics Concern   Not on file  Social History Narrative   Not on file   Social Determinants of Health   Financial Resource Strain: High Risk (07/21/2022)   Overall  Financial Resource Strain (CARDIA)    Difficulty of Paying Living Expenses: Very hard  Food Insecurity: Patient Declined (07/21/2022)   Hunger Vital Sign    Worried About Running Out of Food in the Last Year: Patient declined    Ran Out of Food in the Last Year: Patient declined  Transportation Needs: No Transportation Needs (07/21/2022)   PRAPARE - Administrator, Civil Service (Medical): No    Lack of Transportation (Non-Medical): No  Physical Activity: Insufficiently Active (07/21/2022)   Exercise Vital Sign    Days of Exercise per Week: 6 days    Minutes of Exercise per Session: 10 min  Stress: Stress Concern Present (07/21/2022)   [employer] of Occupational Health - Occupational Stress Questionnaire    Feeling of Stress : To some extent  Social Connections: Unknown (07/21/2022)   Social Connection and Isolation Panel [NHANES]    Frequency of Communication with Friends and Family: Patient declined    Frequency of Social Gatherings with Friends and Family: Patient declined    Attends Religious Services: 1 to 4 times per year    Active Member of [employer] or Organizations: No    Attends Engineer, structural: Not on file    Marital Status: Married   Family History  Problem Relation Age of  Onset   Diabetes Father    Cancer Maternal Grandmother    Cancer Maternal Grandfather    Fibromyalgia Sister    Allergies  Allergen Reactions   Amoxicillin Anaphylaxis and Hives   Penicillins Anaphylaxis and Hives    Has patient had a PCN reaction causing immediate rash, facial/tongue/throat swelling, SOB or lightheadedness with hypotension: Yes Has patient had a PCN reaction causing severe rash involving mucus membranes or skin necrosis: No Has patient had a PCN reaction that required hospitalization 10 hr hospital visit Has patient had a PCN reaction occurring within the last 10 years: No If all of the above answers are "NO", then may proceed with Cephalosporin use.    Prednisone Anaphylaxis   Tramadol    Other Rash and Other (See Comments)    Steroids cause palpitations   Prior to Admission medications   Medication Sig Start Date End Date Taking? Authorizing Provider  Acetaminophen (TYLENOL ARTHRITIS PAIN PO) Take by mouth.   Yes [provider]  cholecalciferol (VITAMIN D3) 25 MCG (1000 UNIT) tablet Take 1,000 Units by mouth daily. Patient not taking: Reported on 09/22/2022 09/17/19  Yes [provider]  Coenzyme Q10 (COQ-10) 100 MG CAPS Take by mouth. Patient not taking: Reported on 09/22/2022   Yes [provider]  diphenhydramine-acetaminophen (TYLENOL PM) 25-500 MG TABS tablet Take 1 tablet by mouth at bedtime as needed.   Yes [provider]  methocarbamol (ROBAXIN) 500 MG tablet Take 1 tablet (500 mg total) by mouth in the morning and at bedtime. 08/28/22  Yes Arfeen, Phillips Grout, MD  Multiple Vitamin (MULTIVITAMIN WITH MINERALS) TABS tablet Take 1 tablet by mouth daily. Patient not taking: Reported on 09/22/2022   Yes [provider]  nortriptyline (PAMELOR) 50 MG capsule Take 1 capsule (50 mg total) by mouth at bedtime. 09/03/22  Yes Arfeen, Phillips Grout, MD  Omega-3 Fatty Acids (FISH OIL) 1000 MG CAPS Take 2,000 mg by mouth daily. Patient not taking: Reported on 09/22/2022   Yes [provider]  pantoprazole (PROTONIX) 40 MG tablet Take 40 mg by mouth daily. 01/02/22 01/02/23 Yes [provider]    ROS: All other systems have been reviewed and were otherwise negative with the exception of those mentioned in the HPI and as above.  Physical Exam: General: Alert, no acute distress Cardiovascular: No pedal edema Respiratory: No cyanosis, no use of accessory musculature GI: No organomegaly, abdomen is soft and non-tender Skin: No lesions in the area of chief complaint Neurologic: Sensation intact distally Psychiatric: Patient is competent for consent with normal mood and affect Lymphatic: No  axillary or cervical lymphadenopathy  MUSCULOSKELETAL:  Right shoulder: active forward elevation 90 degrees. Assisted motion to 170. Positive gross AC tenderness to palpation. Positive Impingement. Bicipital groove tenderness. Positive O'Briens. Positive Speeds.   Imaging: MRI demonstrates intrasubstance split of the biceps tendon and possible leading edge supraspinatus tear. Signs of AC arthrosis with edema in the distal clavicle.   Assessment: RIGHT SHOULDER ROTATOR CUFF REPAIR  Plan: Plan for Procedure(s): SHOULDER ARTHROSCOPY WITH ROTATOR CUFF REPAIR, SUBACROMIAL DECOMPRESSION, DISTAL CLAVICLECTOMY AND BICEPS TENODESIS DISTAL CLAVICLECTOMY BICEPS TENODESIS  The risks benefits and alternatives were discussed with the patient including but not limited to the risks of nonoperative treatment, versus surgical intervention including infection, bleeding, nerve injury,  blood clots, cardiopulmonary complications, morbidity, mortality, among others, and they were willing to proceed.   The patient acknowledged the explanation, agreed to proceed with the plan and consent was signed.   Operative  Plan: Right shoulder scope with SAD, DCE, BT, possible RCR Discharge Medications: standard DVT Prophylaxis: none Physical Therapy: outpatient PT Special Discharge needs: Sling. IceMan   Vernetta Honey, PA-C  09/22/2022 11:57 AM

## 2022-09-22 NOTE — Assessment & Plan Note (Signed)
Following with [facility]

## 2022-09-22 NOTE — Assessment & Plan Note (Signed)
Doing well on nortriptyline.  Following with [facility].  No SI/HI

## 2022-09-23 ENCOUNTER — Encounter: Payer: Self-pay | Admitting: Family Medicine

## 2022-09-23 DIAGNOSIS — R413 Other amnesia: Secondary | ICD-10-CM

## 2022-09-23 LAB — LIPID PANEL
Cholesterol: 142 mg/dL (ref 0–200)
HDL: 34.4 mg/dL — ABNORMAL LOW (ref >39.00)
NonHDL: 107.75
Total CHOL/HDL Ratio: 4
Triglycerides: 313 mg/dL — ABNORMAL HIGH (ref 0.0–149.0)
VLDL: 62.6 mg/dL — ABNORMAL HIGH (ref 0.0–40.0)

## 2022-09-23 LAB — COMPREHENSIVE METABOLIC PANEL
ALT: 16 U/L (ref 0–35)
AST: 18 U/L (ref 0–37)
Albumin: 4.1 g/dL (ref 3.5–5.2)
Alkaline Phosphatase: 70 U/L (ref 39–117)
BUN: 12 mg/dL (ref 6–23)
CO2: 27 meq/L (ref 19–32)
Calcium: 9.3 mg/dL (ref 8.4–10.5)
Chloride: 105 meq/L (ref 96–112)
Creatinine, Ser: 0.95 mg/dL (ref 0.40–1.20)
GFR: 69.36 mL/min (ref >60.00)
Glucose, Bld: 84 mg/dL (ref 70–99)
Potassium: 4.3 meq/L (ref 3.5–5.1)
Sodium: 140 mEq/L (ref 135–145)
Total Bilirubin: 0.4 mg/dL (ref 0.2–1.2)
Total Protein: 6.7 g/dL (ref 6.0–8.3)

## 2022-09-23 LAB — LDL CHOLESTEROL, DIRECT: Direct LDL: 85 mg/dL

## 2022-09-23 LAB — MICROALBUMIN / CREATININE URINE RATIO
Creatinine,U: 28.2 mg/dL
Microalb Creat Ratio: 2.5 mg/g (ref 0.0–30.0)
Microalb, Ur: <0.7 mg/dL (ref 0.0–1.9)

## 2022-09-24 NOTE — Telephone Encounter (Signed)
Referral pended

## 2022-09-24 NOTE — Progress Notes (Signed)
A1c is a little bit higher. At this level we can either focus on lifestyle measures (low carb diet, portion control, regular exercise, weight management), or if you are interested, we can also consider starting medication like metformin. Let me know what you are thinking. Either way, we should recheck your A1c in 3-6 months.   Triglycerides are high, but this is likely from higher blood sugars lately. Please focus on the previously mentioned lifestyle measures. Add an omega-3 supplement. Recheck fasting at next visit.

## 2022-09-25 ENCOUNTER — Ambulatory Visit (HOSPITAL_BASED_OUTPATIENT_CLINIC_OR_DEPARTMENT_OTHER)
Admission: RE | Admit: 2022-09-25 | Discharge: 2022-09-25 | Disposition: A | Discharge status: Home / Self Care | Payer: Medicaid Other | Attending: Orthopaedic Surgery | Admitting: Orthopaedic Surgery

## 2022-09-25 ENCOUNTER — Encounter (HOSPITAL_BASED_OUTPATIENT_CLINIC_OR_DEPARTMENT_OTHER): Payer: Self-pay | Admitting: Orthopaedic Surgery

## 2022-09-25 ENCOUNTER — Ambulatory Visit (HOSPITAL_BASED_OUTPATIENT_CLINIC_OR_DEPARTMENT_OTHER): Payer: Medicaid Other | Admitting: Certified Registered Nurse Anesthetist

## 2022-09-25 ENCOUNTER — Encounter (HOSPITAL_BASED_OUTPATIENT_CLINIC_OR_DEPARTMENT_OTHER)
Admission: RE | Disposition: A | Discharge status: Home / Self Care | Payer: Self-pay | Source: Home / Self Care | Attending: Orthopaedic Surgery

## 2022-09-25 DIAGNOSIS — M19011 Primary osteoarthritis, right shoulder: Secondary | ICD-10-CM | POA: Insufficient documentation

## 2022-09-25 DIAGNOSIS — Z01818 Encounter for other preprocedural examination: Secondary | ICD-10-CM

## 2022-09-25 DIAGNOSIS — M7501 Adhesive capsulitis of right shoulder: Secondary | ICD-10-CM | POA: Diagnosis not present

## 2022-09-25 DIAGNOSIS — K219 Gastro-esophageal reflux disease without esophagitis: Secondary | ICD-10-CM | POA: Insufficient documentation

## 2022-09-25 DIAGNOSIS — M069 Rheumatoid arthritis, unspecified: Secondary | ICD-10-CM | POA: Diagnosis not present

## 2022-09-25 DIAGNOSIS — F17210 Nicotine dependence, cigarettes, uncomplicated: Secondary | ICD-10-CM | POA: Diagnosis not present

## 2022-09-25 DIAGNOSIS — M25811 Other specified joint disorders, right shoulder: Secondary | ICD-10-CM | POA: Diagnosis not present

## 2022-09-25 DIAGNOSIS — Z5986 Financial insecurity: Secondary | ICD-10-CM | POA: Diagnosis not present

## 2022-09-25 DIAGNOSIS — Z79899 Other long term (current) drug therapy: Secondary | ICD-10-CM | POA: Insufficient documentation

## 2022-09-25 HISTORY — PX: SHOULDER ARTHROSCOPY WITH ROTATOR CUFF REPAIR AND SUBACROMIAL DECOMPRESSION: SHX5686

## 2022-09-25 HISTORY — PX: BICEPT TENODESIS: SHX5116

## 2022-09-25 HISTORY — PX: RESECTION DISTAL CLAVICAL: SHX5053

## 2022-09-25 SURGERY — SHOULDER ARTHROSCOPY WITH ROTATOR CUFF REPAIR AND SUBACROMIAL DECOMPRESSION
Anesthesia: General | Site: Shoulder | Laterality: Right

## 2022-09-25 MED ORDER — TRANEXAMIC ACID-NACL 1000-0.7 MG/100ML-% IV SOLN
INTRAVENOUS | Status: AC
  Filled 2022-09-25: qty 100

## 2022-09-25 MED ORDER — FENTANYL CITRATE (PF) 100 MCG/2ML IJ SOLN
INTRAMUSCULAR | Status: AC
  Filled 2022-09-25: qty 2

## 2022-09-25 MED ORDER — CEFAZOLIN SODIUM-DEXTROSE 2-4 GM/100ML-% IV SOLN
INTRAVENOUS | Status: AC
  Filled 2022-09-25: qty 100

## 2022-09-25 MED ORDER — GABAPENTIN 100 MG PO CAPS: 100.0000 mg | ORAL_CAPSULE | Freq: Three times a day (TID) | ORAL | 0 refills | Status: AC

## 2022-09-25 MED ORDER — PROPOFOL 10 MG/ML IV BOLUS
INTRAVENOUS | Status: DC | PRN
  Administered 2022-09-25: 150 mg via INTRAVENOUS

## 2022-09-25 MED ORDER — BUPIVACAINE HCL (PF) 0.5 % IJ SOLN
INTRAMUSCULAR | Status: DC | PRN
  Administered 2022-09-25: 10 mL

## 2022-09-25 MED ORDER — OXYCODONE HCL 5 MG PO TABS: 5.0000 mg | ORAL_TABLET | Freq: Once | ORAL | Status: DC | PRN

## 2022-09-25 MED ORDER — ONDANSETRON HCL 4 MG PO TABS: 4.0000 mg | ORAL_TABLET | Freq: Three times a day (TID) | ORAL | 0 refills | Status: AC | PRN

## 2022-09-25 MED ORDER — TRANEXAMIC ACID-NACL 1000-0.7 MG/100ML-% IV SOLN
1000.0000 mg | INTRAVENOUS | Status: AC
  Administered 2022-09-25: 1000 mg via INTRAVENOUS

## 2022-09-25 MED ORDER — CELECOXIB 100 MG PO CAPS: 100.0000 mg | ORAL_CAPSULE | Freq: Two times a day (BID) | ORAL | 0 refills | Status: AC

## 2022-09-25 MED ORDER — ONDANSETRON HCL 4 MG/2ML IJ SOLN
INTRAMUSCULAR | Status: DC | PRN
  Administered 2022-09-25: 4 mg via INTRAVENOUS

## 2022-09-25 MED ORDER — SUGAMMADEX SODIUM 200 MG/2ML IV SOLN
INTRAVENOUS | Status: DC | PRN
  Administered 2022-09-25: 200 mg via INTRAVENOUS

## 2022-09-25 MED ORDER — ROCURONIUM BROMIDE 10 MG/ML (PF) SYRINGE
PREFILLED_SYRINGE | INTRAVENOUS | Status: DC | PRN
  Administered 2022-09-25: 50 mg via INTRAVENOUS

## 2022-09-25 MED ORDER — ACETAMINOPHEN 500 MG PO TABS: 1000.0000 mg | ORAL_TABLET | Freq: Three times a day (TID) | ORAL | 0 refills | Status: AC

## 2022-09-25 MED ORDER — ALBUTEROL SULFATE HFA 108 (90 BASE) MCG/ACT IN AERS
INHALATION_SPRAY | RESPIRATORY_TRACT | Status: DC | PRN
Start: 2022-09-25 — End: 2022-09-25
  Administered 2022-09-25: 2 via RESPIRATORY_TRACT

## 2022-09-25 MED ORDER — CEFAZOLIN SODIUM-DEXTROSE 2-4 GM/100ML-% IV SOLN: 2.0000 g | INTRAVENOUS | Status: DC

## 2022-09-25 MED ORDER — ACETAMINOPHEN 500 MG PO TABS
1000.0000 mg | ORAL_TABLET | Freq: Once | ORAL | Status: AC
  Administered 2022-09-25: 1000 mg via ORAL

## 2022-09-25 MED ORDER — LACTATED RINGERS IV SOLN: INTRAVENOUS | Status: DC

## 2022-09-25 MED ORDER — FENTANYL CITRATE (PF) 250 MCG/5ML IJ SOLN
INTRAMUSCULAR | Status: DC | PRN
  Administered 2022-09-25: 100 ug via INTRAVENOUS

## 2022-09-25 MED ORDER — LIDOCAINE 2% (20 MG/ML) 5 ML SYRINGE
INTRAMUSCULAR | Status: AC
  Filled 2022-09-25: qty 5

## 2022-09-25 MED ORDER — VANCOMYCIN HCL IN DEXTROSE 1-5 GM/200ML-% IV SOLN
INTRAVENOUS | Status: AC
  Filled 2022-09-25: qty 200

## 2022-09-25 MED ORDER — PHENYLEPHRINE HCL-NACL 20-0.9 MG/250ML-% IV SOLN
INTRAVENOUS | Status: DC | PRN
Start: 2022-09-25 — End: 2022-09-25
  Administered 2022-09-25: 50 ug/min via INTRAVENOUS
  Administered 2022-09-25: 25 ug/min via INTRAVENOUS

## 2022-09-25 MED ORDER — GABAPENTIN 300 MG PO CAPS
300.0000 mg | ORAL_CAPSULE | Freq: Once | ORAL | Status: AC
  Administered 2022-09-25: 300 mg via ORAL

## 2022-09-25 MED ORDER — ONDANSETRON HCL 4 MG/2ML IJ SOLN: 4.0000 mg | Freq: Once | INTRAMUSCULAR | Status: DC | PRN

## 2022-09-25 MED ORDER — FENTANYL CITRATE (PF) 100 MCG/2ML IJ SOLN
25.0000 ug | INTRAMUSCULAR | Status: DC | PRN
  Administered 2022-09-25 (×2): 50 ug via INTRAVENOUS
  Administered 2022-09-25: 25 ug via INTRAVENOUS

## 2022-09-25 MED ORDER — SODIUM CHLORIDE 0.9 % IR SOLN
Status: DC | PRN
  Administered 2022-09-25: 6000 mL

## 2022-09-25 MED ORDER — MIDAZOLAM HCL 2 MG/2ML IJ SOLN
2.0000 mg | Freq: Once | INTRAMUSCULAR | Status: AC
  Administered 2022-09-25: 2 mg via INTRAVENOUS

## 2022-09-25 MED ORDER — ROCURONIUM BROMIDE 10 MG/ML (PF) SYRINGE
PREFILLED_SYRINGE | INTRAVENOUS | Status: AC
  Filled 2022-09-25: qty 10

## 2022-09-25 MED ORDER — CLINDAMYCIN PHOSPHATE 900 MG/50ML IV SOLN
INTRAVENOUS | Status: DC | PRN
  Administered 2022-09-25: 900 mg via INTRAVENOUS

## 2022-09-25 MED ORDER — GABAPENTIN 300 MG PO CAPS
ORAL_CAPSULE | ORAL | Status: AC
  Filled 2022-09-25: qty 1

## 2022-09-25 MED ORDER — ONDANSETRON HCL 4 MG/2ML IJ SOLN
INTRAMUSCULAR | Status: AC
  Filled 2022-09-25: qty 2

## 2022-09-25 MED ORDER — DEXAMETHASONE SODIUM PHOSPHATE 10 MG/ML IJ SOLN
INTRAMUSCULAR | Status: AC
  Filled 2022-09-25: qty 1

## 2022-09-25 MED ORDER — OXYCODONE HCL 5 MG/5ML PO SOLN: 5.0000 mg | Freq: Once | ORAL | Status: DC | PRN

## 2022-09-25 MED ORDER — ALBUTEROL SULFATE HFA 108 (90 BASE) MCG/ACT IN AERS
INHALATION_SPRAY | RESPIRATORY_TRACT | Status: AC
  Filled 2022-09-25: qty 6.7

## 2022-09-25 MED ORDER — ACETAMINOPHEN 500 MG PO TABS
ORAL_TABLET | ORAL | Status: AC
  Filled 2022-09-25: qty 2

## 2022-09-25 MED ORDER — ACETAMINOPHEN 10 MG/ML IV SOLN: 1000.0000 mg | Freq: Once | INTRAVENOUS | Status: DC | PRN

## 2022-09-25 MED ORDER — MIDAZOLAM HCL 2 MG/2ML IJ SOLN
INTRAMUSCULAR | Status: AC
  Filled 2022-09-25: qty 2

## 2022-09-25 MED ORDER — CLINDAMYCIN PHOSPHATE 900 MG/50ML IV SOLN
INTRAVENOUS | Status: AC
  Filled 2022-09-25: qty 50

## 2022-09-25 MED ORDER — BUPIVACAINE LIPOSOME 1.3 % IJ SUSP
INTRAMUSCULAR | Status: DC | PRN
  Administered 2022-09-25: 10 mL

## 2022-09-25 MED ORDER — OXYCODONE HCL 5 MG PO TABS: ORAL_TABLET | ORAL | 0 refills | Status: AC

## 2022-09-25 SURGICAL SUPPLY — 55 items
AID PSTN UNV HD RSTRNT DISP (MISCELLANEOUS) ×1
ANCH SUT 2 FBRTK KNTLS 1.8 (Anchor) ×2 IMPLANT
ANCHOR SUT 1.8 FIBERTAK SB KL (Anchor) IMPLANT
APL PRP STRL LF DISP 70% ISPRP (MISCELLANEOUS) ×1
BLADE EXCALIBUR 4.0X13 (MISCELLANEOUS) ×1 IMPLANT
BURR OVAL 8 FLU 4.0X13 (MISCELLANEOUS) ×1 IMPLANT
CANNULA 5.75X71 LONG (CANNULA) IMPLANT
CANNULA PASSPORT 5 (CANNULA) IMPLANT
CANNULA PASSPORT BUTTON 10-40 (CANNULA) IMPLANT
CANNULA TWIST IN 8.25X7CM (CANNULA) IMPLANT
CHLORAPREP W/TINT 26 (MISCELLANEOUS) ×1 IMPLANT
CLSR STERI-STRIP ANTIMIC 1/2X4 (GAUZE/BANDAGES/DRESSINGS) ×1 IMPLANT
COOLER ICEMAN CLASSIC (MISCELLANEOUS) ×1 IMPLANT
DRAPE IMP U-DRAPE 54X76 (DRAPES) ×1 IMPLANT
DRAPE INCISE IOBAN 66X45 STRL (DRAPES) IMPLANT
DRAPE SHOULDER BEACH CHAIR (DRAPES) ×1 IMPLANT
DW OUTFLOW CASSETTE/TUBE SET (MISCELLANEOUS) ×1 IMPLANT
GAUZE PAD ABD 8X10 STRL (GAUZE/BANDAGES/DRESSINGS) ×1 IMPLANT
GAUZE SPONGE 4X4 12PLY STRL (GAUZE/BANDAGES/DRESSINGS) ×1 IMPLANT
GLOVE BIO SURGEON STRL SZ 6.5 (GLOVE) ×1 IMPLANT
GLOVE BIOGEL PI IND STRL 6.5 (GLOVE) ×1 IMPLANT
GLOVE BIOGEL PI IND STRL 8 (GLOVE) ×1 IMPLANT
GLOVE ECLIPSE 8.0 STRL XLNG CF (GLOVE) ×1 IMPLANT
GOWN STRL REUS W/ TWL LRG LVL3 (GOWN DISPOSABLE) ×2 IMPLANT
GOWN STRL REUS W/TWL LRG LVL3 (GOWN DISPOSABLE) ×2
GOWN STRL REUS W/TWL XL LVL3 (GOWN DISPOSABLE) ×1 IMPLANT
KIT SHOULDER STAB MARCO (KITS) ×1 IMPLANT
KIT STR SPEAR 1.8 FBRTK DISP (KITS) IMPLANT
LASSO 90 CVE QUICKPAS (DISPOSABLE) IMPLANT
LASSO CRESCENT QUICKPASS (SUTURE) IMPLANT
MANIFOLD NEPTUNE II (INSTRUMENTS) ×1 IMPLANT
NDL HD SCORPION MEGA LOADER (NEEDLE) IMPLANT
NDL SAFETY ECLIP 18X1.5 (MISCELLANEOUS) ×1 IMPLANT
PACK ARTHROSCOPY DSU (CUSTOM PROCEDURE TRAY) ×1 IMPLANT
PACK BASIN DAY SURGERY FS (CUSTOM PROCEDURE TRAY) ×1 IMPLANT
PAD COLD SHLDR WRAP-ON (PAD) ×1 IMPLANT
PAD ORTHO SHOULDER 7X19 LRG (SOFTGOODS) ×1 IMPLANT
PORT APPOLLO RF 90DEGREE MULTI (SURGICAL WAND) ×1 IMPLANT
RESTRAINT HEAD UNIVERSAL NS (MISCELLANEOUS) ×1 IMPLANT
SHEET MEDIUM DRAPE 40X70 STRL (DRAPES) ×1 IMPLANT
SLEEVE SCD COMPRESS KNEE MED (STOCKING) ×1 IMPLANT
SLING ARM FOAM STRAP LRG (SOFTGOODS) IMPLANT
SUT FIBERWIRE #2 38 T-5 BLUE (SUTURE)
SUT MNCRL AB 4-0 PS2 18 (SUTURE) ×1 IMPLANT
SUT PDS AB 0 CT 36 (SUTURE) IMPLANT
SUT PDS AB 1 CT 36 (SUTURE) IMPLANT
SUT TIGER TAPE 7 IN WHITE (SUTURE) IMPLANT
SUTURE FIBERWR #2 38 T-5 BLUE (SUTURE) IMPLANT
SUTURE TAPE TIGERLINK 1.3MM BL (SUTURE) IMPLANT
SUTURETAPE TIGERLINK 1.3MM BL (SUTURE)
SYR 5ML LL (SYRINGE) ×1 IMPLANT
TAPE FIBER 2MM 7IN #2 BLUE (SUTURE) IMPLANT
TOWEL GREEN STERILE FF (TOWEL DISPOSABLE) ×2 IMPLANT
TUBE CONNECTING 20X1/4 (TUBING) ×1 IMPLANT
TUBING ARTHROSCOPY IRRIG 16FT (MISCELLANEOUS) ×1 IMPLANT

## 2022-09-25 NOTE — Anesthesia Procedure Notes (Signed)
Anesthesia Regional Block: Interscalene brachial plexus block   Pre-Anesthetic Checklist: , timeout performed,  Correct Patient, Correct Site, Correct Laterality,  Correct Procedure, Correct Position, site marked,  Risks and benefits discussed,  Pre-op evaluation,  At surgeon's request and post-op pain management  Laterality: Right  Prep: Maximum Sterile Barrier Precautions used, chloraprep       Needles:  Injection technique: Single-shot  Needle Type: Echogenic Stimulator Needle     Needle Length: 5cm  Needle Gauge: 22     Additional Needles:   Procedures:,,,, ultrasound used (permanent image in chart),,    Narrative:  Start time: 09/25/2022 10:20 AM End time: 09/25/2022 10:25 AM Injection made incrementally with aspirations every 5 mL.  Performed by: Personally  Anesthesiologist: Mariann Barter, MD  Additional Notes: 2% Lidocaine skin wheel.

## 2022-09-25 NOTE — Progress Notes (Signed)
Assisted Dr. Ace Gins with right, interscalene, ultrasound guided block. Side rails up, monitors on throughout procedure. See vital signs in flow sheet. Tolerated Procedure well.

## 2022-09-25 NOTE — Anesthesia Procedure Notes (Signed)
Procedure Name: Intubation Date/Time: 09/25/2022 11:33 AM  Performed by: Demetrio Lapping, CRNAPre-anesthesia Checklist: Patient identified, Emergency Drugs available, Suction available and Patient being monitored Patient Re-evaluated:Patient Re-evaluated prior to induction Oxygen Delivery Method: Circle System Utilized Preoxygenation: Pre-oxygenation with 100% oxygen Induction Type: IV induction Ventilation: Mask ventilation without difficulty and Oral airway inserted - appropriate to patient size Laryngoscope Size: Mac and 3 Grade View: Grade II Tube type: Oral Tube size: 7.0 mm Number of attempts: 1 Airway Equipment and Method: Stylet Placement Confirmation: ETT inserted through vocal cords under direct vision, positive ETCO2 and breath sounds checked- equal and bilateral Secured at: 22 cm Tube secured with: Tape Dental Injury: Teeth and Oropharynx as per pre-operative assessment

## 2022-09-25 NOTE — Anesthesia Preprocedure Evaluation (Signed)
Anesthesia Evaluation  Patient identified by MRN, date of birth, ID band Patient awake    Reviewed: Allergy & Precautions, NPO status , Patient's Chart, lab work & pertinent test results, reviewed documented beta blocker date and time   History of Anesthesia Complications Negative for: history of anesthetic complications  Airway Mallampati: IV   Neck ROM: Limited  Mouth opening: Limited Mouth Opening  Dental  (+) Edentulous Upper   Pulmonary neg shortness of breath, neg COPD, neg recent URI, Current Smoker and Patient abstained from smoking.   breath sounds clear to auscultation       Cardiovascular (-) hypertension(-) CAD, (-) Past MI, (-) Cardiac Stents and (-) CABG (-) pacemaker Rhythm:Regular Rate:Normal     Neuro/Psych neg Seizures PSYCHIATRIC DISORDERS Anxiety     Recent concussion    GI/Hepatic ,GERD  Medicated,,(+) neg Cirrhosis        Endo/Other    Renal/GU Renal disease     Musculoskeletal  (+) Arthritis ,    Abdominal   Peds  Hematology   Anesthesia Other Findings   Reproductive/Obstetrics                              Anesthesia Physical Anesthesia Plan  ASA: 2  Anesthesia Plan: General   Post-op Pain Management: Regional block*   Induction: Intravenous  PONV Risk Score and Plan: 1 and Ondansetron  Airway Management Planned: Oral ETT and Video Laryngoscope Planned  Additional Equipment:   Intra-op Plan:   Post-operative Plan: Extubation in OR  Informed Consent: I have reviewed the patients History and Physical, chart, labs and discussed the procedure including the risks, benefits and alternatives for the proposed anesthesia with the patient or authorized representative who has indicated his/her understanding and acceptance.     Dental advisory given  Plan Discussed with: CRNA  Anesthesia Plan Comments:          Anesthesia Quick Evaluation

## 2022-09-25 NOTE — Discharge Instructions (Addendum)
Ramond Marrow MD, MPH Alfonse Alpers, PA-C [facility] [address], [address] [phone] (tel)   [phone] (fax)   POST-OPERATIVE INSTRUCTIONS - SHOULDER ARTHROSCOPY  WOUND CARE You may remove the Operative Dressing on Post-Op Day #3 (72hrs after surgery).   Alternatively if you would like you can leave dressing on until follow-up if within 7-8 days but keep it dry. Leave steri-strips in place until they fall off on their own, usually 2 weeks postop. There may be a small amount of fluid/bleeding leaking at the surgical site.  This is normal; the shoulder is filled with fluid during the procedure and can leak for 24-48hrs after surgery.  You may change/reinforce the bandage as needed.  Use the Cryocuff or Ice as often as possible for the first 7 days, then as needed for pain relief. Always keep a towel, ACE wrap or other barrier between the cooling unit and your skin.  You may shower on Post-Op Day #3. Gently pat the area dry.  Do not soak the shoulder in water or submerge it.  Keep incisions as dry as possible. Do not go swimming in the pool or ocean until 4 weeks after surgery or when otherwise instructed.    EXERCISES Wear the sling at all times  You may remove the sling for showering, but keep the arm across the chest or in a secondary sling.     It is normal for your fingers/hand to become more swollen after surgery and discolored from bruising.   This will resolve over the first few weeks usually after surgery. Please continue to ambulate and do not stay sitting or lying for too long.  Perform foot and wrist pumps to assist in circulation.  PHYSICAL THERAPY - You will begin physical therapy soon after surgery (unless otherwise specified) - Please call to set up an appointment, if you do not already have one  - Let our office if there are any issues with scheduling your therapy   - A PT referral was sent to [facility] in [place]  REGIONAL  ANESTHESIA (NERVE BLOCKS) The anesthesia team may have performed a nerve block for you this is a great tool used to minimize pain.   The block may start wearing off overnight (between 8-24 hours postop) When the block wears off, your pain may go from nearly zero to the pain you would have had postop without the block. This is an abrupt transition but nothing dangerous is happening.   This can be a challenging period but utilize your as needed pain medications to try and manage this period. We suggest you use the pain medication the first night prior to going to bed, to ease this transition.  You may take an extra dose of narcotic when this happens if needed  POST-OP MEDICATIONS- Multimodal approach to pain control In general your pain will be controlled with a combination of substances.  Prescriptions unless otherwise discussed are electronically sent to your pharmacy.  This is a carefully made plan we use to minimize narcotic use.     Celebrex - Anti-inflammatory medication taken on a scheduled basis Acetaminophen - Non-narcotic pain medicine taken on a scheduled basis  Gabapentin - this is to help with nerve based pain, take on a scheduled basis Oxycodone - This is a strong narcotic, to be used only on an "as needed" basis for SEVERE pain. Zofran - take as needed for nausea  FOLLOW-UP If you develop a Fever (?101.5), Redness or Drainage  from the surgical incision site, please call our office to arrange for an evaluation. Please call the office to schedule a follow-up appointment for your first post-operative appointment, 7-10 days post-operatively.    HELPFUL INFORMATION   You may be more comfortable sleeping in a semi-seated position the first few nights following surgery.  Keep a pillow propped under the elbow and forearm for comfort.  If you have a recliner type of chair it might be beneficial.  If not that is fine too, but it would be helpful to sleep propped up with pillows behind  your operated shoulder as well under your elbow and forearm.  This will reduce pulling on the suture lines.  When dressing, put your operative arm in the sleeve first.  When getting undressed, take your operative arm out last.  Loose fitting, button-down shirts are recommended.  Often in the first days after surgery you may be more comfortable keeping your operative arm under your shirt and not through the sleeve.  You may return to work/school in the next couple of days when you feel up to it.  Desk work and typing in the sling is fine.  We suggest you use the pain medication the first night prior to going to bed, in order to ease any pain when the anesthesia wears off. You should avoid taking pain medications on an empty stomach as it will make you nauseous.  You should wean off your narcotic medicines as soon as you are able.  Most patients will be off narcotics before their first postop appointment.   Do not drink alcoholic beverages or take illicit drugs when taking pain medications.  It is against the law to drive while taking narcotics.  In some states it is against the law to drive while your arm is in a sling.   Pain medication may make you constipated.  Below are a few solutions to try in this order: Decrease the amount of pain medication if you aren't having pain. Drink lots of decaffeinated fluids. Drink prune juice and/or eat dried prunes  If the first 3 don't work start with additional solutions Take Colace - an over-the-counter stool softener Take Senokot - an over-the-counter laxative Take Miralax - a stronger over-the-counter laxative  For more information including helpful videos and documents visit our website:   [email or web address]       Post Anesthesia Home Care Instructions  Activity: Get plenty of rest for the remainder of the day. A responsible individual must stay with you for 24 hours following the procedure.  For the  next 24 hours, DO NOT: -Drive a car -Advertising copywriter -Drink alcoholic beverages -Take any medication unless instructed by your physician -Make any legal decisions or sign important papers.  Meals: Start with liquid foods such as gelatin or soup. Progress to regular foods as tolerated. Avoid greasy, spicy, heavy foods. If nausea and/or vomiting occur, drink only clear liquids until the nausea and/or vomiting subsides. Call your physician if vomiting continues.  Special Instructions/Symptoms: Your throat may feel dry or sore from the anesthesia or the breathing tube placed in your throat during surgery. If this causes discomfort, gargle with warm salt water. The discomfort should disappear within 24 hours.  If you had a scopolamine patch placed behind your ear for the management of post- operative nausea and/or vomiting:  1. The medication in the patch is effective for 72 hours, after which it should be removed.  Wrap patch in a tissue and discard in the trash.  Wash hands thoroughly with soap and water. 2. You may remove the patch earlier than 72 hours if you experience unpleasant side effects which may include dry mouth, dizziness or visual disturbances. 3. Avoid touching the patch. Wash your hands with soap and water after contact with the patch.      Regional Anesthesia Blocks  1. You may not be able to move or feel the "blocked" extremity after a regional anesthetic block. This may last may last from 3-48 hours after placement, but it will go away. The length of time depends on the medication injected and your individual response to the medication. As the nerves start to wake up, you may experience tingling as the movement and feeling returns to your extremity. If the numbness and inability to move your extremity has not gone away after 48 hours, please call your surgeon.   2. The extremity that is blocked will need to be protected until the numbness is gone and the strength has  returned. Because you cannot feel it, you will need to take extra care to avoid injury. Because it may be weak, you may have difficulty moving it or using it. You may not know what position it is in without looking at it while the block is in effect.  3. For blocks in the legs and feet, returning to weight bearing and walking needs to be done carefully. You will need to wait until the numbness is entirely gone and the strength has returned. You should be able to move your leg and foot normally before you try and bear weight or walk. You will need someone to be with you when you first try to ensure you do not fall and possibly risk injury.  4. Bruising and tenderness at the needle site are common side effects and will resolve in a few days.  5. Persistent numbness or new problems with movement should be communicated to the surgeon or the [facility] [phone] [facility] [phone]).    Information for Discharge Teaching: EXPAREL (bupivacaine liposome injectable suspension)   Pain relief is important to your recovery. The goal is to control your pain so you can move easier and return to your normal activities as soon as possible after your procedure. Your physician may use several types of medicines to manage pain, swelling, and more.  Your surgeon or anesthesiologist gave you EXPAREL(bupivacaine) to help control your pain after surgery.  EXPAREL is a local anesthetic designed to release slowly over an extended period of time to provide pain relief by numbing the tissue around the surgical site. EXPAREL is designed to release pain medication over time and can control pain for up to 72 hours. Depending on how you respond to EXPAREL, you may require less pain medication during your recovery. EXPAREL can help reduce or eliminate the need for opioids during the first few days after surgery when pain relief is needed the most. EXPAREL is not an opioid and is not  addictive. It does not cause sleepiness or sedation.   Important! A teal colored band has been placed on your arm with the date, time and amount of EXPAREL you have received. Please leave this armband in place for the full 96 hours following administration, and then you may remove the band. If you return to the hospital for any reason within 96 hours following the administration of EXPAREL, the armband provides important information that your health care providers to know, and alerts them that you have  received this anesthetic.    Possible side effects of EXPAREL: Temporary loss of sensation or ability to move in the area where medication was injected. Nausea, vomiting, constipation Rarely, numbness and tingling in your mouth or lips, lightheadedness, or anxiety may occur. Call your doctor right away if you think you may be experiencing any of these sensations, or if you have other questions regarding possible side effects.  Follow all other discharge instructions given to you by your surgeon or nurse. Eat a healthy diet and drink plenty of water or other fluids.    Next dose of Tylenol may be given at 3:45pm if needed.

## 2022-09-25 NOTE — Interval H&P Note (Signed)
All questions answered, patient wants to proceed with procedure. ? ?

## 2022-09-25 NOTE — Transfer of Care (Signed)
Immediate Anesthesia Transfer of Care Note  Patient: Maria Bailey  Procedure(s) Performed: SHOULDER ARTHROSCOPY WITH ROTATOR CUFF REPAIR, SUBACROMIAL DECOMPRESSION, DISTAL CLAVICLECTOMY AND BICEPS TENODESIS (Right: Shoulder) DISTAL CLAVICLECTOMY (Right: Shoulder) BICEPS TENODESIS (Right: Shoulder)  Patient Location: PACU  Anesthesia Type:General  Level of Consciousness: awake and patient cooperative  Airway & Oxygen Therapy: Patient Spontanous Breathing and Patient connected to face mask oxygen  Post-op Assessment: Report given to RN and Post -op Vital signs reviewed and stable  Post vital signs: Reviewed and stable  Last Vitals:  Vitals Value Taken Time  BP 123/64 09/25/22 1300  Temp 36.3 C 09/25/22 1245  Pulse 90 09/25/22 1300  Resp 20 09/25/22 1300  SpO2 98 % 09/25/22 1300    Last Pain:  Vitals:   09/25/22 1300  TempSrc:   PainSc: Asleep      Patients Stated Pain Goal: 3 (09/25/22 0941)  Complications: No notable events documented.

## 2022-09-26 ENCOUNTER — Encounter (HOSPITAL_BASED_OUTPATIENT_CLINIC_OR_DEPARTMENT_OTHER): Payer: Self-pay | Admitting: Orthopaedic Surgery

## 2022-09-26 NOTE — Op Note (Signed)
Orthopaedic Surgery Operative Note (CSN: [id number])  Maria Bailey  [birth year] Date of Surgery: 09/25/2022   DIAGNOSES: Right shoulder, SLAP tear, biceps tendinitis, AC arthritis, and subacromial impingement.  POST-OPERATIVE DIAGNOSIS: same  PROCEDURE: Arthroscopic extensive debridement - 29823 Subdeltoid Bursa, Supraspinatus Tendon, Anterior Labrum, Superior Labrum, and Posterior Labrum Arthroscopic distal clavicle excision - 29562 Arthroscopic subacromial decompression - 13086 Arthroscopic biceps tenodesis - 57846   OPERATIVE FINDING: Exam under anesthesia: Normal Articular space:  Anterior and posterior tearing of labrum , significant capsulitis of anterior interval, released and skeletonized at time of surgery. Chondral surfaces: Normal Biceps:  intrasubstance tearing of biceps and significant SLAP tear type 2 Subscapularis: Intact  Supraspinatus: Intact  Infraspinatus: Intact      Post-operative plan: The patient will be non-weightbearing in a sling .  The patient will be discharged home.  DVT prophylaxis not indicated in ambulatory upper extremity patient without known risk factors.   Pain control with PRN pain medication preferring oral medicines.  Follow up plan will be scheduled in approximately 7 days for incision check and XR.  Surgeons:Primary: Bjorn Pippin, MD Assistants:Caroline McBane PA-C Location: [facility] OR ROOM 6 Anesthesia: General with Exparel interscalene block Antibiotics: Clindamycin Tourniquet time: None Estimated Blood Loss: Minimal Complications: None Specimens: None Implants: Implant Name Type Inv. Item Serial No. Manufacturer Lot No. LRB No. Used Action  ANCH SUT 2 FBRTK KNTLS 1.8 - [id number] Anchor ANCH SUT 2 FBRTK KNTLS 1.8  [employer] [id number] Right 1 Implanted  ANCH SUT 2 FBRTK KNTLS 1.8 - [id number] Anchor ANCH SUT 2 FBRTK KNTLS 1.8  [employer] [id number] A Right 1 Implanted    Indications for Surgery:   Maria Bailey is a 52 y.o. female  with continued shoulder pain refractory to nonoperative measures for extended period of time.    The risks and benefits were explained at length including but not limited to continued pain, cuff failure, biceps tenodesis failure, stiffness, need for further surgery and infection.   Procedure:   Patient was correctly identified in the preoperative holding area and operative site marked.  Patient brought to OR and positioned beachchair on an [place] table ensuring that all bony prominences were padded and the head was in an appropriate location.  Anesthesia was induced and the operative shoulder was prepped and draped in the usual sterile fashion.  Timeout was called preincision.  A standard posterior viewing portal was made after localizing the portal with a spinal needle.  An anterior accessory portal was also made.  After clearing the articular space the camera was positioned in the subacromial space.  Findings above.    Extensive debridement was performed of the anterior interval tissue, labral fraying and the bursa.  Glenoid bone, glenoid cartilage, humeral bone were all debrided.  Subacromial decompression: We made a lateral portal with spinal needle guidance. We then proceeded to debride bursal tissue extensively with a shaver and arthrocare device. At that point we continued to identify the borders of the acromion and identify the spur. We then carefully preserved the deltoid fascia and used a burr to convert the acromion to a Type 1 flat acromion without issue.  Biceps tenodesis: We marked the tendon and then performed a tenotomy and debridement of the stump in the articular space. We then identified the biceps tendon in its groove suprapec with the arthroscope in the lateral portal taking care to move from lateral to medial to avoid injury to the subscapularis. At that point we unroofed the tendon itself and  mobilized it. An accessory anterior portal was made in line with the tendon and we grasped  it from the anterior superior portal and worked from the accessory anterior portal. Two Fibertak 1.20mm knotless anchors were placed in the groove and the tendon was secured in a luggage loop style fashion with a pass of the limb of suture through the tendon using a scorpion device to avoid pull-through.  Repair was completed with good tension on the tendon.  Residual stump of the tendon was removed after being resected with a RF ablator.  Distal Clavicle resection:  The scope was placed in the subacromial space from the posterior portal.  A hemostat was placed through the anterior portal and we spread at the [facility] joint.  A burr was then inserted and 10 mm of distal clavicle was resected taking care to avoid damage to the capsule around the joint and avoiding overhanging bone posteriorly.      The incisions were closed with absorbable monocryl and steri strips.  A sterile dressing was placed along with a sling. The patient was awoken from general anesthesia and taken to the PACU in stable condition without complication.   Alfonse Alpers, PA-C, present and scrubbed throughout the case, critical for completion in a timely fashion, and for retraction, instrumentation, closure.

## 2022-09-28 ENCOUNTER — Other Ambulatory Visit (HOSPITAL_COMMUNITY): Payer: Self-pay | Admitting: Psychiatry

## 2022-09-28 DIAGNOSIS — F418 Other specified anxiety disorders: Secondary | ICD-10-CM

## 2022-09-28 DIAGNOSIS — F4310 Post-traumatic stress disorder, unspecified: Secondary | ICD-10-CM

## 2022-09-29 NOTE — Anesthesia Postprocedure Evaluation (Signed)
Anesthesia Post Note  Patient: Maria Bailey  Procedure(s) Performed: SHOULDER ARTHROSCOPY WITH ROTATOR CUFF REPAIR, SUBACROMIAL DECOMPRESSION, DISTAL CLAVICLECTOMY AND BICEPS TENODESIS (Right: Shoulder) DISTAL CLAVICLECTOMY (Right: Shoulder) BICEPS TENODESIS (Right: Shoulder)     Patient location during evaluation: PACU Anesthesia Type: General Level of consciousness: awake and alert Pain management: pain level controlled Vital Signs Assessment: post-procedure vital signs reviewed and stable Respiratory status: spontaneous breathing, nonlabored ventilation, respiratory function stable and patient connected to nasal cannula oxygen Cardiovascular status: blood pressure returned to baseline and stable Postop Assessment: no apparent nausea or vomiting Anesthetic complications: no   No notable events documented.  Last Vitals:  Vitals:   09/25/22 1443 09/25/22 1446  BP: (!) 117/58   Pulse: 83   Resp: 16   Temp: (!) 36.3 C   SpO2: 97% 95%    Last Pain:  Vitals:   09/25/22 0941  TempSrc: Temporal                 Mariann Barter

## 2022-10-14 ENCOUNTER — Other Ambulatory Visit (HOSPITAL_COMMUNITY): Payer: Self-pay

## 2022-10-14 DIAGNOSIS — F418 Other specified anxiety disorders: Secondary | ICD-10-CM

## 2022-10-14 DIAGNOSIS — F4310 Post-traumatic stress disorder, unspecified: Secondary | ICD-10-CM

## 2022-10-14 MED ORDER — NORTRIPTYLINE HCL 50 MG PO CAPS
50.0000 mg | ORAL_CAPSULE | Freq: Every day | ORAL | 0 refills | Status: DC
Start: 2022-10-14 — End: 2022-11-26

## 2022-11-05 ENCOUNTER — Telehealth (HOSPITAL_COMMUNITY): Payer: Medicaid Other | Admitting: Psychiatry

## 2022-11-24 ENCOUNTER — Telehealth (HOSPITAL_COMMUNITY): Payer: Medicaid Other | Admitting: Psychiatry

## 2022-11-26 ENCOUNTER — Telehealth (HOSPITAL_BASED_OUTPATIENT_CLINIC_OR_DEPARTMENT_OTHER): Payer: Medicaid Other | Admitting: Psychiatry

## 2022-11-26 ENCOUNTER — Encounter (HOSPITAL_COMMUNITY): Payer: Self-pay | Admitting: Psychiatry

## 2022-11-26 VITALS — Wt 208.0 lb

## 2022-11-26 DIAGNOSIS — F4310 Post-traumatic stress disorder, unspecified: Secondary | ICD-10-CM | POA: Diagnosis not present

## 2022-11-26 DIAGNOSIS — F418 Other specified anxiety disorders: Secondary | ICD-10-CM

## 2022-11-26 MED ORDER — NORTRIPTYLINE HCL 50 MG PO CAPS
50.0000 mg | ORAL_CAPSULE | Freq: Every day | ORAL | 2 refills | Status: DC
Start: 2022-11-26 — End: 2023-02-26
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
## 2022-12-29 ENCOUNTER — Ambulatory Visit: Payer: Medicaid Other | Admitting: Neurology

## 2023-02-10 ENCOUNTER — Encounter: Payer: Self-pay | Admitting: Family Medicine

## 2023-02-10 ENCOUNTER — Encounter (HOSPITAL_COMMUNITY): Payer: Self-pay

## 2023-02-11 ENCOUNTER — Other Ambulatory Visit (HOSPITAL_COMMUNITY): Payer: Self-pay | Admitting: Psychiatry

## 2023-02-19 ENCOUNTER — Encounter: Payer: Self-pay | Admitting: Neurology

## 2023-02-26 ENCOUNTER — Encounter (HOSPITAL_COMMUNITY): Payer: Self-pay | Admitting: Psychiatry

## 2023-02-26 ENCOUNTER — Telehealth (HOSPITAL_COMMUNITY): Payer: Medicaid Other | Admitting: Psychiatry

## 2023-02-26 VITALS — Wt 245.0 lb

## 2023-02-26 DIAGNOSIS — F418 Other specified anxiety disorders: Secondary | ICD-10-CM | POA: Diagnosis not present

## 2023-02-26 DIAGNOSIS — F4310 Post-traumatic stress disorder, unspecified: Secondary | ICD-10-CM | POA: Diagnosis not present

## 2023-02-26 MED ORDER — NORTRIPTYLINE HCL 75 MG PO CAPS
75.0000 mg | ORAL_CAPSULE | Freq: Every day | ORAL | 1 refills | Status: DC
Start: 2023-02-26 — End: 2023-04-27
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
## 2023-03-19 ENCOUNTER — Encounter: Payer: Self-pay | Admitting: Family Medicine

## 2023-03-19 MED ORDER — ACCU-CHEK GUIDE TEST VI STRP: ORAL_STRIP | 12 refills | Status: AC

## 2023-03-21 ENCOUNTER — Other Ambulatory Visit (HOSPITAL_COMMUNITY): Payer: Self-pay | Admitting: Psychiatry

## 2023-03-21 DIAGNOSIS — F418 Other specified anxiety disorders: Secondary | ICD-10-CM

## 2023-03-21 DIAGNOSIS — F4310 Post-traumatic stress disorder, unspecified: Secondary | ICD-10-CM

## 2023-03-25 ENCOUNTER — Telehealth (INDEPENDENT_AMBULATORY_CARE_PROVIDER_SITE_OTHER): Payer: Medicaid Other | Admitting: Family Medicine

## 2023-03-25 ENCOUNTER — Encounter: Payer: Self-pay | Admitting: Family Medicine

## 2023-03-25 VITALS — Ht 62.0 in | Wt 260.0 lb

## 2023-03-25 DIAGNOSIS — E119 Type 2 diabetes mellitus without complications: Secondary | ICD-10-CM

## 2023-03-25 DIAGNOSIS — Z1211 Encounter for screening for malignant neoplasm of colon: Secondary | ICD-10-CM | POA: Diagnosis not present

## 2023-03-25 DIAGNOSIS — K219 Gastro-esophageal reflux disease without esophagitis: Secondary | ICD-10-CM | POA: Diagnosis not present

## 2023-03-25 DIAGNOSIS — E785 Hyperlipidemia, unspecified: Secondary | ICD-10-CM

## 2023-03-25 DIAGNOSIS — M778 Other enthesopathies, not elsewhere classified: Secondary | ICD-10-CM

## 2023-03-25 DIAGNOSIS — Z1159 Encounter for screening for other viral diseases: Secondary | ICD-10-CM

## 2023-03-25 DIAGNOSIS — F419 Anxiety disorder, unspecified: Secondary | ICD-10-CM

## 2023-03-25 DIAGNOSIS — Z124 Encounter for screening for malignant neoplasm of cervix: Secondary | ICD-10-CM | POA: Diagnosis not present

## 2023-03-25 DIAGNOSIS — Z114 Encounter for screening for human immunodeficiency virus [HIV]: Secondary | ICD-10-CM

## 2023-03-25 MED ORDER — PANTOPRAZOLE SODIUM 40 MG PO TBEC: 40.0000 mg | DELAYED_RELEASE_TABLET | Freq: Every day | ORAL | 1 refills | Status: DC

## 2023-03-25 MED ORDER — MELOXICAM 15 MG PO TABS
15.0000 mg | ORAL_TABLET | Freq: Every day | ORAL | 1 refills | Status: DC
Start: 2023-03-25 — End: 2023-11-12

## 2023-03-25 NOTE — Progress Notes (Signed)
Virtual Video Visit via MyChart Note  I connected with  Maria Bailey on 03/25/23 at  2:00 PM EST by the video enabled telemedicine application for MyChart, and verified that I am speaking with the correct person using two identifiers.   I introduced myself as a Publishing rights manager with the practice. We discussed the limitations of evaluation and management by telemedicine and the availability of in person appointments. The patient expressed understanding and agreed to proceed.  Participating parties in this visit include: The patient and the nurse practitioner listed.  The patient is: At home I am: at home  Subjective:    CC:  Chief Complaint  Patient presents with   Medical Management of Chronic Issues    HPI: Maria Bailey is a 53 y.o. year old female presenting today via MyChart today for chronic disease management.   Discussed the use of AI scribe software for clinical note transcription with the patient, who gave verbal consent to proceed.  History of Present Illness   Maria Bailey is a 53 year old female who presents for a regular follow-up visit.  She experiences frequent and severe heartburn, particularly after running out of Protonix, with symptoms occurring with every meal. She is also willing to get GI referral for routine colonoscopy.   Her blood sugar levels fluctuate between 98 and 250 mg/dL, typically staying between 100 and 130 mg/dL. She monitors her blood sugar regularly, with fasting levels between 98 and 117 mg/dL. She is taking fish oil omega-3 supplements for cholesterol management.  She follows a pattern of not eating until 1 PM and then finds it difficult to stop eating. She is attempting to reduce smoking, which she feels increases her need to eat, and is trying to follow a low-carb diet.  Post shoulder surgery, she experiences a knot in her arm and tightness from the back into her shoulder. She sees PT and uses tape to alleviate symptoms while waiting for  dry needling.  She is due for routine women's health checks, including a gynecological exam and a pap smear, which she has not had in about ten years. She would like a GYN referral for this.   She filed for disability last year and was not approved. She is seeking statements from her specialists to support her case.       Mood follow-up: - Diagnosis: anxiety, PTSD - Treatment: nortriptyline 75 mg daily (prescribed by Dr. Lolly Mustache) - Medication side effects: no - SI/HI: no   Hyperlipidemia: - medications: Omega-3 supplement - compliance: good - medication SEs: no The 10-year ASCVD risk score (Arnett DK, et al., 2019) is: 9.4%   Values used to calculate the score:     Age: 89 years     Sex: Female     Is Non-Hispanic African American: No     Diabetic: Yes     Tobacco smoker: Yes     Systolic Blood Pressure: 136 mmHg     Is BP treated: No     HDL Cholesterol: 34.4 mg/dL     Total Cholesterol: 142 mg/dL   Diabetes: - Checking glucose at home: occasionally, varies - Medications: none, preferred lifestyle measures - Compliance: n/a - Diet: low carb - Exercise: minimal, as tolerated - Eye exam: - - Foot exam: - - Microalbumin: UTD  - Denies symptoms of hypoglycemia, polyuria, polydipsia, numbness extremities, foot ulcers/trauma, wounds that are not healing, medication side effects  Lab Results  Component Value Date   HGBA1C 7.1 (H) 09/22/2022  Past medical history, Surgical history, Family history not pertinant except as noted below, Social history, Allergies, and medications have been entered into the medical record, reviewed, and corrections made.   Review of Systems:  All review of systems negative except what is listed in the HPI   Objective:    General:  Speaking clearly in complete sentences. Absent shortness of breath noted.   Alert and oriented x3.   Normal judgment.  Absent acute distress.   Impression and Recommendations:    Problem List  Items Addressed This Visit       Active Problems   Anxiety (Chronic)   Managed with Nortriptyline 75mg  daily and regular appointments with Dr. Lolly Mustache. Stable. No SI/HI. -Continue current regimen.      Relevant Medications   LORazepam (ATIVAN) 1 MG tablet   Hyperlipidemia (Chronic)   Continue Omega-3 supplement. Labs ordered. Lifestyle factors for lowering cholesterol include: Diet therapy - heart-healthy diet rich in fruits, veggies, fiber-rich whole grains, lean meats, chicken, fish (at least twice a week), fat-free or 1% dairy products; foods low in saturated/trans fats, cholesterol, sodium, and sugar. Mediterranean diet has shown to be very heart healthy. Regular exercise - recommend at least 30 minutes a day, 5 times per week Weight management        Relevant Orders   Comprehensive metabolic panel   Lipid panel   DM (diabetes mellitus) (HCC) - Primary (Chronic)   Lab Results  Component Value Date   HGBA1C 7.1 (H) 09/22/2022   Updating labs. No current medications for hyperglycemia.  Discussed heart healthy, low-carb diet and regular exercise.        Relevant Orders   CBC with Differential/Platelet   Comprehensive metabolic panel   Hemoglobin A1c   TSH   Capsulitis of right shoulder   Post-surgical pain and tightness. Currently managed with Meloxicam. -Refill Meloxicam. -Consider referral to a faster physical therapy service if current provider's wait time for dry needling remains long.      Relevant Medications   meloxicam (MOBIC) 15 MG tablet   Gastroesophageal reflux disease   Frequent heartburn, especially after eating. Ran out of Protonix. -Refill Protonix. -Refer for colonoscopy and possible endoscopy if not improving with supportive measures.      Relevant Medications   pantoprazole (PROTONIX) 40 MG tablet   Other Visit Diagnoses       Colon cancer screening       Relevant Orders   Ambulatory referral to Gastroenterology     Cervical cancer  screening       Relevant Orders   Ambulatory referral to Gynecology     Encounter for screening for HIV       Relevant Orders   HIV Antibody (routine testing w rflx)     Encounter for hepatitis C screening test for low risk patient       Relevant Orders   Hepatitis C antibody       Return in about 6 months (around 09/22/2023) for physical / schedule lab appointment within the next week.    Follow-up if symptoms worsen or fail to improve.    I discussed the assessment and treatment plan with the patient. The patient was provided an opportunity to ask questions and all were answered. The patient agreed with the plan and demonstrated an understanding of the instructions.   The patient was advised to call back or seek an in-person evaluation if the symptoms worsen or if the condition fails to improve as anticipated.   Ladona Ridgel  Therese Sarah, NP

## 2023-03-25 NOTE — Assessment & Plan Note (Signed)
Lab Results  Component Value Date   HGBA1C 7.1 (H) 09/22/2022   Updating labs. No current medications for hyperglycemia.  Discussed heart healthy, low-carb diet and regular exercise.

## 2023-03-25 NOTE — Assessment & Plan Note (Signed)
Managed with Nortriptyline 75mg  daily and regular appointments with Dr. Lolly Mustache. Stable. No SI/HI. -Continue current regimen.

## 2023-03-25 NOTE — Assessment & Plan Note (Signed)
Frequent heartburn, especially after eating. Ran out of Protonix. -Refill Protonix. -Refer for colonoscopy and possible endoscopy if not improving with supportive measures.

## 2023-03-25 NOTE — Progress Notes (Signed)
Protonix 40 mg  and Meloxicam - she is out and wants to restart (neither have been prescribed by you)  No other concerns  PHQ/GAD done   Wants referral for pap and colonoscopy

## 2023-03-25 NOTE — Assessment & Plan Note (Signed)
Continue Omega-3 supplement. Labs ordered. Lifestyle factors for lowering cholesterol include: Diet therapy - heart-healthy diet rich in fruits, veggies, fiber-rich whole grains, lean meats, chicken, fish (at least twice a week), fat-free or 1% dairy products; foods low in saturated/trans fats, cholesterol, sodium, and sugar. Mediterranean diet has shown to be very heart healthy. Regular exercise - recommend at least 30 minutes a day, 5 times per week Weight management

## 2023-03-25 NOTE — Assessment & Plan Note (Signed)
Post-surgical pain and tightness. Currently managed with Meloxicam. -Refill Meloxicam. -Consider referral to a faster physical therapy service if current provider's wait time for dry needling remains long.

## 2023-04-15 ENCOUNTER — Encounter: Payer: Medicaid Other | Admitting: Family Medicine

## 2023-04-20 ENCOUNTER — Ambulatory Visit: Admitting: Family Medicine

## 2023-04-20 ENCOUNTER — Encounter: Payer: Self-pay | Admitting: Family Medicine

## 2023-04-20 VITALS — BP 131/58 | HR 96 | Ht 62.0 in | Wt 219.0 lb

## 2023-04-20 DIAGNOSIS — E119 Type 2 diabetes mellitus without complications: Secondary | ICD-10-CM

## 2023-04-20 DIAGNOSIS — Z114 Encounter for screening for human immunodeficiency virus [HIV]: Secondary | ICD-10-CM | POA: Diagnosis not present

## 2023-04-20 DIAGNOSIS — F17210 Nicotine dependence, cigarettes, uncomplicated: Secondary | ICD-10-CM

## 2023-04-20 DIAGNOSIS — Z0000 Encounter for general adult medical examination without abnormal findings: Secondary | ICD-10-CM

## 2023-04-20 DIAGNOSIS — J988 Other specified respiratory disorders: Secondary | ICD-10-CM

## 2023-04-20 DIAGNOSIS — F419 Anxiety disorder, unspecified: Secondary | ICD-10-CM

## 2023-04-20 DIAGNOSIS — Z1159 Encounter for screening for other viral diseases: Secondary | ICD-10-CM

## 2023-04-20 DIAGNOSIS — M778 Other enthesopathies, not elsewhere classified: Secondary | ICD-10-CM

## 2023-04-20 MED ORDER — METHOCARBAMOL 500 MG PO TABS: 500.0000 mg | ORAL_TABLET | Freq: Two times a day (BID) | ORAL | 2 refills | Status: DC

## 2023-04-20 MED ORDER — BENZONATATE 200 MG PO CAPS: 200.0000 mg | ORAL_CAPSULE | Freq: Three times a day (TID) | ORAL | 0 refills | Status: DC | PRN

## 2023-04-20 MED ORDER — DOXYCYCLINE HYCLATE 100 MG PO CAPS: 100.0000 mg | ORAL_CAPSULE | Freq: Two times a day (BID) | ORAL | 0 refills | Status: AC

## 2023-04-20 MED ORDER — HYDROCOD POLI-CHLORPHE POLI ER 10-8 MG/5ML PO SUER: 5.0000 mL | Freq: Two times a day (BID) | ORAL | 0 refills | Status: AC | PRN

## 2023-04-20 NOTE — Assessment & Plan Note (Signed)
 Managed with Nortriptyline 75mg  daily and regular counseling. Stable. No SI/HI. -Continue current regimen.

## 2023-04-20 NOTE — Progress Notes (Signed)
 Complete physical exam  Patient: Maria Bailey   DOB: [birth year]   53 y.o. Female  MRN: [id number]  Subjective:    Chief Complaint  Patient presents with  . Annual Exam    Maria Bailey is a 53 y.o. female who presents today for a complete physical exam. She reports consuming a general diet. The patient does not participate in regular exercise at present. She generally feels fairly well. She reports sleeping fairly well. She does not have additional problems to discuss today.   Currently lives with: spouse Acute concerns or interim problems since last visit: >1 week sinus pressure, nasal congestion, cough, sneezing; no fevers, chills, but she has been more fatigued; spouse had a similar virus the week prior but recovered; taking Dayquil, robitussin, Claritin, etc.   Vision concerns: yes, planning to see  Dental concerns: no STD concerns: no  ETOH use: no Nicotine use: 2 packs/day Recreational drugs/illegal substances: no      Most recent fall risk assessment:    04/20/2023    1:58 PM  Fall Risk   Falls in the past year? 0  Number falls in past yr: 0  Injury with Fall? 0  Risk for fall due to : No Fall Risks  Follow up Falls evaluation completed     Most recent depression screenings:    04/20/2023    2:27 PM 03/25/2023   12:56 PM  PHQ 2/9 Scores  PHQ - 2 Score 2 2  PHQ- 9 Score 17 14       04/20/2023    2:28 PM 03/25/2023   12:58 PM 09/22/2022   11:19 AM 02/20/2022    2:31 PM  GAD 7 : Generalized Anxiety Score  Nervous, Anxious, on Edge 3 3 1 3   Control/stop worrying 3 3 2 2   Worry too much - different things 3 0 1 3  Trouble relaxing 2 2 1 2   Restless 3 0 1 2  Easily annoyed or irritable 3 3 2 3   Afraid - awful might happen 2 3 2 3   Total GAD 7 Score 19 14 10 18   Anxiety Difficulty Very difficult Extremely difficult Somewhat difficult Extremely difficult              Patient Care Team: Clayborne Dana, NP as PCP - General (Family  Medicine) Delfin Gant, MD as Attending Physician (Family Medicine)   Outpatient Medications Prior to Visit  Medication Sig  . cholecalciferol (VITAMIN D3) 25 MCG (1000 UNIT) tablet Take 1,000 Units by mouth daily.  . Coenzyme Q10 (COQ-10) 100 MG CAPS Take by mouth.  . diphenhydramine-acetaminophen (TYLENOL PM) 25-500 MG TABS tablet Take 1 tablet by mouth at bedtime as needed.  . gabapentin (NEURONTIN) 100 MG capsule Take 100 mg by mouth 3 (three) times daily.  Marland Kitchen glucose blood (ACCU-CHEK GUIDE TEST) test strip Use as instructed  . LORazepam (ATIVAN) 1 MG tablet Take 1 mg by mouth at bedtime.  . meloxicam (MOBIC) 15 MG tablet Take 1 tablet (15 mg total) by mouth daily.  . Multiple Vitamin (MULTIVITAMIN WITH MINERALS) TABS tablet Take 1 tablet by mouth daily.  . nortriptyline (PAMELOR) 75 MG capsule Take 1 capsule (75 mg total) by mouth at bedtime.  . Omega-3 Fatty Acids (FISH OIL) 1000 MG CAPS Take 2,000 mg by mouth daily.  . pantoprazole (PROTONIX) 40 MG tablet Take 1 tablet (40 mg total) by mouth daily.  . [DISCONTINUED] methocarbamol (ROBAXIN) 500 MG tablet Take 1 tablet (500 mg  total) by mouth in the morning and at bedtime.   No facility-administered medications prior to visit.    ROS All review of systems negative except what is listed in the HPI        Objective:     BP (!) 131/58   Pulse 96   Ht 5\' 2"  (1.575 m)   Wt 219 lb (99.3 kg)   LMP 10/04/2021 (Approximate)   SpO2 97%   BMI 40.06 kg/m    Physical Exam Vitals reviewed.  Constitutional:      General: She is not in acute distress.    Appearance: Normal appearance. She is not ill-appearing.  HENT:     Head: Normocephalic and atraumatic.     Right Ear: Tympanic membrane normal.     Left Ear: Tympanic membrane normal.     Nose: Nose normal.     Mouth/Throat:     Mouth: Mucous membranes are moist.     Pharynx: Oropharynx is clear.  Eyes:     Extraocular Movements: Extraocular movements intact.      Conjunctiva/sclera: Conjunctivae normal.     Pupils: Pupils are equal, round, and reactive to light.  Cardiovascular:     Rate and Rhythm: Normal rate and regular rhythm.     Pulses: Normal pulses.     Heart sounds: Normal heart sounds.  Pulmonary:     Effort: Pulmonary effort is normal.     Breath sounds: Normal breath sounds.  Abdominal:     General: Abdomen is flat. Bowel sounds are normal. There is no distension.     Palpations: Abdomen is soft. There is no mass.     Tenderness: There is no abdominal tenderness. There is no right CVA tenderness, left CVA tenderness, guarding or rebound.  Genitourinary:    Comments: Deferred exam Musculoskeletal:        General: No swelling.     Cervical back: Normal range of motion and neck supple. No tenderness.     Right lower leg: No edema.     Left lower leg: No edema.     Comments: Right shoulder limited ROM  Lymphadenopathy:     Cervical: No cervical adenopathy.  Skin:    General: Skin is warm and dry.     Capillary Refill: Capillary refill takes less than 2 seconds.  Neurological:     General: No focal deficit present.     Mental Status: She is alert and oriented to person, place, and time. Mental status is at baseline.  Psychiatric:        Mood and Affect: Mood normal.        Behavior: Behavior normal.        Thought Content: Thought content normal.        Judgment: Judgment normal.     No results found for any visits on 04/20/23.     Assessment & Plan:    Routine Health Maintenance and Physical Exam Discussed health promotion and safety including diet and exercise recommendations, dental health, and injury prevention. Tobacco cessation if applicable. Seat belts, sunscreen, smoke detectors, etc.    Immunization History  Administered Date(s) Administered  . PFIZER(Purple Top)SARS-COV-2 Vaccination 06/19/2019, 07/17/2019  . Zoster Recombinant(Shingrix) 04/10/2021, 09/09/2021    Health Maintenance  Topic Date Due  . HIV  Screening  Never done  . Hepatitis C Screening  Never done  . Cervical Cancer Screening (HPV/Pap Cotest)  Never done  . Colonoscopy  Never done  . Lung Cancer Screening  Never done  .  OPHTHALMOLOGY EXAM  03/05/2023  . HEMOGLOBIN A1C  03/25/2023  . INFLUENZA VACCINE  05/04/2023 (Originally 09/04/2022)  . Pneumococcal Vaccine 44-68 Years old (1 of 2 - PCV) 03/24/2024 (Originally 03/14/1976)  . COVID-19 Vaccine (3 - 2024-25 season) 04/13/2024 (Originally 10/05/2022)  . Diabetic kidney evaluation - eGFR measurement  09/22/2023  . Diabetic kidney evaluation - Urine ACR  09/22/2023  . FOOT EXAM  04/19/2024  . MAMMOGRAM  07/27/2024  . Zoster Vaccines- Shingrix  Completed  . HPV VACCINES  Aged Out  . DTaP/Tdap/Td  Discontinued        Problem List Items Addressed This Visit       Active Problems   Anxiety (Chronic)   Managed with Nortriptyline 75mg  daily and regular counseling. Stable. No SI/HI. -Continue current regimen.      DM (diabetes mellitus) (HCC) (Chronic)   Lab Results  Component Value Date   HGBA1C 7.1 (H) 09/22/2022   Updating labs.  Discussed heart healthy, low-carb diet and regular exercise.        Relevant Orders   Microalbumin / creatinine urine ratio   Hemoglobin A1c   Ambulatory referral to Ophthalmology   Capsulitis of right shoulder   Continue with PT/ortho Refill of Robaxin provided       Relevant Medications   methocarbamol (ROBAXIN) 500 MG tablet   Other Visit Diagnoses       Annual physical exam    -  Primary   Relevant Orders   Microalbumin / creatinine urine ratio   Hemoglobin A1c   Comprehensive metabolic panel   CBC with Differential/Platelet   Lipid panel   TSH     Encounter for screening for HIV       Relevant Orders   HIV Antibody (routine testing w rflx)     Encounter for hepatitis C screening test for low risk patient       Relevant Orders   Hepatitis C antibody     Cigarette nicotine dependence without complication        Relevant Orders   Ambulatory Referral Lung Cancer Screening [facility]     Respiratory infection     Given duration and severity of symptoms, adding doxycyline and cough medicine. Recommend Mucinex and supportive measures - rest, hydration, humidifier use, steam showers, warm compresses to sinuses, warm liquids with lemon and honey, and over-the-counter cough, cold, and analgesics as needed.     Relevant Medications   benzonatate (TESSALON) 200 MG capsule   chlorpheniramine-HYDROcodone (TUSSIONEX) 10-8 MG/5ML   doxycycline (VIBRAMYCIN) 100 MG capsule        PATIENT COUNSELING:    Sexuality: Discussed sexually transmitted diseases, partner selection, use of condoms, avoidance of unintended pregnancy, and contraceptive alternatives.   Encouraged smoking cessation.   I discussed with the patient that most people either abstain from alcohol or drink within safe limits (<=14/week and <=4 drinks/occasion for males, <=7/weeks and <= 3 drinks/occasion for females) and that the risk for alcohol disorders and other health effects rises proportionally with the number of drinks per week and how often a drinker exceeds daily limits.  Discussed cessation/primary prevention of drug use and availability of treatment for abuse.   Diet: Encouraged to adjust caloric intake to maintain or achieve ideal body weight, to reduce intake of dietary saturated fat and total fat, to limit sodium intake by avoiding high sodium foods and not adding table salt, and to maintain adequate dietary potassium and calcium preferably from fresh fruits, vegetables, and low-fat dairy products. Encouraged  vitamin D 1000 units and Calcium 1300mg  or 4 servings of dairy a day.  Emphasized the importance of regular exercise.  Injury prevention: Discussed safety belts, safety helmets, smoke detector, smoking near bedding or upholstery.   Dental health: Discussed importance of regular tooth brushing, flossing, and dental  visits.       Return in about 6 months (around 10/21/2023) for routine follow-up.     Clayborne Dana, NP

## 2023-04-20 NOTE — Assessment & Plan Note (Signed)
 Lab Results  Component Value Date   HGBA1C 7.1 (H) 09/22/2022   Updating labs.  Discussed heart healthy, low-carb diet and regular exercise.

## 2023-04-20 NOTE — Assessment & Plan Note (Signed)
 Continue with PT/ortho Refill of Robaxin provided

## 2023-04-21 LAB — CBC WITH DIFFERENTIAL/PLATELET
Basophils Absolute: 0 10*3/uL (ref 0.0–0.1)
Basophils Relative: 0.4 % (ref 0.0–3.0)
Eosinophils Absolute: 0.1 10*3/uL (ref 0.0–0.7)
Eosinophils Relative: 1.7 % (ref 0.0–5.0)
HCT: 41.1 % (ref 36.0–46.0)
Hemoglobin: 13.3 g/dL (ref 12.0–15.0)
Lymphocytes Relative: 20.9 % (ref 12.0–46.0)
Lymphs Abs: 1.7 10*3/uL (ref 0.7–4.0)
MCHC: 32.4 g/dL (ref 30.0–36.0)
MCV: 86.5 fl (ref 78.0–100.0)
Monocytes Absolute: 0.3 10*3/uL (ref 0.1–1.0)
Monocytes Relative: 4 % (ref 3.0–12.0)
Neutro Abs: 6.1 10*3/uL (ref 1.4–7.7)
Neutrophils Relative %: 73 % (ref 43.0–77.0)
Platelets: 221 10*3/uL (ref 150.0–400.0)
RBC: 4.75 Mil/uL (ref 3.87–5.11)
RDW: 15.4 % (ref 11.5–15.5)
WBC: 8.3 10*3/uL (ref 4.0–10.5)

## 2023-04-21 LAB — HIV ANTIBODY (ROUTINE TESTING W REFLEX): HIV 1&2 Ab, 4th Generation: NONREACTIVE

## 2023-04-21 LAB — LIPID PANEL
Cholesterol: 169 mg/dL (ref 0–200)
HDL: 31.8 mg/dL — ABNORMAL LOW (ref >39.00)
LDL Cholesterol: 109 mg/dL — ABNORMAL HIGH (ref 0–99)
NonHDL: 137.14
Total CHOL/HDL Ratio: 5
Triglycerides: 142 mg/dL (ref 0.0–149.0)
VLDL: 28.4 mg/dL (ref 0.0–40.0)

## 2023-04-21 LAB — COMPREHENSIVE METABOLIC PANEL
ALT: 33 U/L (ref 0–35)
AST: 28 U/L (ref 0–37)
Albumin: 4 g/dL (ref 3.5–5.2)
Alkaline Phosphatase: 71 U/L (ref 39–117)
BUN: 15 mg/dL (ref 6–23)
CO2: 29 meq/L (ref 19–32)
Calcium: 8.8 mg/dL (ref 8.4–10.5)
Chloride: 102 meq/L (ref 96–112)
Creatinine, Ser: 0.95 mg/dL (ref 0.40–1.20)
GFR: 69.08 mL/min (ref >60.00)
Glucose, Bld: 111 mg/dL — ABNORMAL HIGH (ref 70–99)
Potassium: 4.5 meq/L (ref 3.5–5.1)
Sodium: 139 meq/L (ref 135–145)
Total Bilirubin: 0.4 mg/dL (ref 0.2–1.2)
Total Protein: 6.8 g/dL (ref 6.0–8.3)

## 2023-04-21 LAB — MICROALBUMIN / CREATININE URINE RATIO
Creatinine,U: 76.9 mg/dL
Microalb Creat Ratio: UNDETERMINED mg/g (ref 0.0–30.0)
Microalb, Ur: <0.7 mg/dL

## 2023-04-21 LAB — HEPATITIS C ANTIBODY: Hepatitis C Ab: NONREACTIVE

## 2023-04-21 LAB — HEMOGLOBIN A1C: Hgb A1c MFr Bld: 7.8 % — ABNORMAL HIGH (ref 4.6–6.5)

## 2023-04-21 LAB — TSH: TSH: 1.27 u[IU]/mL (ref 0.35–5.50)

## 2023-04-22 ENCOUNTER — Encounter: Payer: Self-pay | Admitting: Family Medicine

## 2023-04-22 DIAGNOSIS — E119 Type 2 diabetes mellitus without complications: Secondary | ICD-10-CM

## 2023-04-22 MED ORDER — DAPAGLIFLOZIN PROPANEDIOL 5 MG PO TABS: 5.0000 mg | ORAL_TABLET | Freq: Every day | ORAL | 3 refills | Status: AC

## 2023-04-27 ENCOUNTER — Encounter (HOSPITAL_COMMUNITY): Payer: Self-pay | Admitting: Psychiatry

## 2023-04-27 ENCOUNTER — Telehealth (HOSPITAL_BASED_OUTPATIENT_CLINIC_OR_DEPARTMENT_OTHER): Payer: Medicaid Other | Admitting: Psychiatry

## 2023-04-27 DIAGNOSIS — F418 Other specified anxiety disorders: Secondary | ICD-10-CM | POA: Diagnosis not present

## 2023-04-27 DIAGNOSIS — F4310 Post-traumatic stress disorder, unspecified: Secondary | ICD-10-CM | POA: Diagnosis not present

## 2023-04-27 MED ORDER — NORTRIPTYLINE HCL 75 MG PO CAPS: 75.0000 mg | ORAL_CAPSULE | Freq: Every day | ORAL | 2 refills | Status: DC

## 2023-04-27 NOTE — Progress Notes (Signed)
 [facility] MD Virtual Progress Note   Patient Location: Home Provider Location: Home Office  I connect with patient by video and verified that I am speaking with correct person by using two identifiers. I discussed the limitations of evaluation and management by telemedicine and the availability of in person appointments. I also discussed with the patient that there may be a patient responsible charge related to this service. The patient expressed understanding and agreed to proceed.  Maria Bailey [id number] 53 y.o.  04/27/2023 2:35 PM  History of Present Illness:  Patient is evaluated by video session.  She is doing better and is started going outside every day with her sister-in-law who does [employer].  She is hoping that helps but anxiety so 1 day she cannot drive.  We have increased nortriptyline 75 mg at last visit and she noticed much improvement in her sleep, depression, anxiety.  She is still have nightmares and flashback especially when she is around people or in the car but she realized that she need to desensitize her anxiety.  She denies any crying spells or any suicidal thoughts.  She is in therapy and that has been going very well.  She is seeing Helmut Muster every week.  She denies drinking or using any illegal substances.  She admitted some physiological symptoms of anxiety as sometimes she has blurry vision and could not see very well animals and peripheries.  Her primary care recommended to see eye doctor.  She also had a blood work and found to have increased hemoglobin A1c.  She is trying to lose weight and watching her calorie intake.  She is hoping to walking and going outside helps her anxiety, weight.  She denies any suicidal thoughts or homicidal thoughts.  She is pleased that her wife's nephew finally got arrested in [place] and serving time.  Patient has no tremors shakes or any EPS.  She denies any drinking or using any illegal substances.  She is seeing neurology  at [facility] for her chronic neurological symptoms.  Past Psychiatric History: No previous history of suicidal attempt, inpatient treatment, paranoia, psychosis, hallucination.  Zoloft and trazodone caused swelling.     Outpatient Encounter Medications as of 04/27/2023  Medication Sig   benzonatate (TESSALON) 200 MG capsule Take 1 capsule (200 mg total) by mouth 3 (three) times daily as needed for cough.   cholecalciferol (VITAMIN D3) 25 MCG (1000 UNIT) tablet Take 1,000 Units by mouth daily.   Coenzyme Q10 (COQ-10) 100 MG CAPS Take by mouth.   dapagliflozin propanediol (FARXIGA) 5 MG TABS tablet Take 1 tablet (5 mg total) by mouth daily.   diphenhydramine-acetaminophen (TYLENOL PM) 25-500 MG TABS tablet Take 1 tablet by mouth at bedtime as needed.   doxycycline (VIBRAMYCIN) 100 MG capsule Take 1 capsule (100 mg total) by mouth 2 (two) times daily for 10 days.   gabapentin (NEURONTIN) 100 MG capsule Take 100 mg by mouth 3 (three) times daily.   glucose blood (ACCU-CHEK GUIDE TEST) test strip Use as instructed   meloxicam (MOBIC) 15 MG tablet Take 1 tablet (15 mg total) by mouth daily.   methocarbamol (ROBAXIN) 500 MG tablet Take 1 tablet (500 mg total) by mouth in the morning and at bedtime.   Multiple Vitamin (MULTIVITAMIN WITH MINERALS) TABS tablet Take 1 tablet by mouth daily.   nortriptyline (PAMELOR) 75 MG capsule Take 1 capsule (75 mg total) by mouth at bedtime.   Omega-3 Fatty Acids (FISH OIL) 1000 MG CAPS Take 2,000  mg by mouth daily.   pantoprazole (PROTONIX) 40 MG tablet Take 1 tablet (40 mg total) by mouth daily.   [DISCONTINUED] LORazepam (ATIVAN) 1 MG tablet Take 1 mg by mouth at bedtime.   No facility-administered encounter medications on file as of 04/27/2023.    Recent Results (from the past 2160 hours)  Microalbumin / creatinine urine ratio     Status: None   Collection Time: 04/20/23  2:40 PM  Result Value Ref Range   Microalb, Ur <0.7 mg/dL   Creatinine,U 38.7 mg/dL    Microalb Creat Ratio Unable to calculate 0.0 - 30.0 mg/g    Comment: Unable to Calculate due to Microalbumin Result of <0.7 mg/dL  Hemoglobin F6E     Status: Abnormal   Collection Time: 04/20/23  2:40 PM  Result Value Ref Range   Hgb A1c MFr Bld 7.8 (H) 4.6 - 6.5 %    Comment: Glycemic Control Guidelines for People with Diabetes:Non Diabetic:  <6%Goal of Therapy: <7%Additional Action Suggested:  >8%   Comprehensive metabolic panel     Status: Abnormal   Collection Time: 04/20/23  2:40 PM  Result Value Ref Range   Sodium 139 135 - 145 mEq/L   Potassium 4.5 3.5 - 5.1 mEq/L   Chloride 102 96 - 112 mEq/L   CO2 29 19 - 32 mEq/L   Glucose, Bld 111 (H) 70 - 99 mg/dL   BUN 15 6 - 23 mg/dL   Creatinine, Ser 3.32 0.40 - 1.20 mg/dL   Total Bilirubin 0.4 0.2 - 1.2 mg/dL   Alkaline Phosphatase 71 39 - 117 U/L   AST 28 0 - 37 U/L   ALT 33 0 - 35 U/L   Total Protein 6.8 6.0 - 8.3 g/dL   Albumin 4.0 3.5 - 5.2 g/dL   GFR 95.18 >84.16 mL/min    Comment: Calculated using the CKD-EPI Creatinine Equation (2021)   Calcium 8.8 8.4 - 10.5 mg/dL  CBC with Differential/Platelet     Status: None   Collection Time: 04/20/23  2:40 PM  Result Value Ref Range   WBC 8.3 4.0 - 10.5 K/uL   RBC 4.75 3.87 - 5.11 Mil/uL   Hemoglobin 13.3 12.0 - 15.0 g/dL   HCT 60.6 30.1 - 60.1 %   MCV 86.5 78.0 - 100.0 fl   MCHC 32.4 30.0 - 36.0 g/dL   RDW 09.3 23.5 - 57.3 %   Platelets 221.0 150.0 - 400.0 K/uL   Neutrophils Relative % 73.0 43.0 - 77.0 %   Lymphocytes Relative 20.9 12.0 - 46.0 %   Monocytes Relative 4.0 3.0 - 12.0 %   Eosinophils Relative 1.7 0.0 - 5.0 %   Basophils Relative 0.4 0.0 - 3.0 %   Neutro Abs 6.1 1.4 - 7.7 K/uL   Lymphs Abs 1.7 0.7 - 4.0 K/uL   Monocytes Absolute 0.3 0.1 - 1.0 K/uL   Eosinophils Absolute 0.1 0.0 - 0.7 K/uL   Basophils Absolute 0.0 0.0 - 0.1 K/uL  Lipid panel     Status: Abnormal   Collection Time: 04/20/23  2:40 PM  Result Value Ref Range   Cholesterol 169 0 - 200 mg/dL     Comment: ATP III Classification       Desirable:  < 200 mg/dL               Borderline High:  200 - 239 mg/dL          High:  > = 220 mg/dL   Triglycerides 254.0  0.0 - 149.0 mg/dL    Comment: Normal:  <295 mg/dLBorderline High:  150 - 199 mg/dL   HDL 62.13 (L) >08.65 mg/dL   VLDL 78.4 0.0 - 69.6 mg/dL   LDL Cholesterol 295 (H) 0 - 99 mg/dL   Total CHOL/HDL Ratio 5     Comment:                Men          Women1/2 Average Risk     3.4          3.3Average Risk          5.0          4.42X Average Risk          9.6          7.13X Average Risk          15.0          11.0                       NonHDL 137.14     Comment: NOTE:  Non-HDL goal should be 30 mg/dL higher than patient's LDL goal (i.e. LDL goal of < 70 mg/dL, would have non-HDL goal of < 100 mg/dL)  TSH     Status: None   Collection Time: 04/20/23  2:40 PM  Result Value Ref Range   TSH 1.27 0.35 - 5.50 uIU/mL  Hepatitis C antibody     Status: None   Collection Time: 04/20/23  2:40 PM  Result Value Ref Range   Hepatitis C Ab NON-REACTIVE NON-REACTIVE    Comment: . HCV antibody was non-reactive. There is no laboratory  evidence of HCV infection. . In most cases, no further action is required. However, if recent HCV exposure is suspected, a test for HCV RNA (test code 28413) is suggested. . For additional information please refer to http://education.[facility].com/faq/FAQ22v1 (This link is being provided for informational/ educational purposes only.) .   HIV Antibody (routine testing w rflx)     Status: None   Collection Time: 04/20/23  2:40 PM  Result Value Ref Range   HIV 1&2 Ab, 4th Generation NON-REACTIVE NON-REACTIVE    Comment: HIV-1 antigen and HIV-1/HIV-2 antibodies were not detected. There is no laboratory evidence of HIV infection. Marland Kitchen PLEASE NOTE: This information has been disclosed to you from records whose confidentiality may be protected by state law.  If your state requires such protection, then the state  law prohibits you from making any further disclosure of the information without the specific written consent of the person to whom it pertains, or as otherwise permitted by law. A general authorization for the release of medical or other information is NOT sufficient for this purpose. . For additional information please refer to http://education.[facility].com/faq/FAQ106 (This link is being provided for informational/ educational purposes only.) . Marland Kitchen The performance of this assay has not been clinically validated in patients less than 42 years old. Marland Kitchen      Psychiatric Specialty Exam: Physical Exam  Review of Systems  Eyes:        Blurry vision  Neurological:  Positive for numbness and headaches.    Weight 219 lb (99.3 kg), last menstrual period 10/04/2021.There is no height or weight on file to calculate BMI.  General Appearance: Casual  Eye Contact:  Good  Speech:  Normal Rate  Volume:  Normal  Mood:  Anxious  Affect:  Appropriate  Thought Process:  Goal Directed  Orientation:  Full (Time, Place, and Person)  Thought Content:  Logical  Suicidal Thoughts:  No  Homicidal Thoughts:  No  Memory:  Immediate;   Good Recent;   Good Remote;   Fair  Judgement:  Intact  Insight:  Present  Psychomotor Activity:  Normal  Concentration:  Concentration: Fair and Attention Span: Fair  Recall:  Good  Fund of Knowledge:  Good  Language:  Good  Akathisia:  No  Handed:  Right  AIMS (if indicated):     Assets:  Communication Skills Desire for Improvement Housing Social Support  ADL's:  Intact  Cognition:  WNL  Sleep:  better      04/27/2023    2:55 PM 04/20/2023    2:28 PM 03/25/2023   12:58 PM 09/22/2022   11:19 AM  GAD 7 : Generalized Anxiety Score  Nervous, Anxious, on Edge 2 3 3 1   Control/stop worrying 2 3 3 2   Worry too much - different things 2 3 0 1  Trouble relaxing 1 2 2 1   Restless 1 3 0 1  Easily annoyed or irritable 1 3 3 2   Afraid - awful might  happen 1 2 3 2   Total GAD 7 Score 10 19 14 10   Anxiety Difficulty Somewhat difficult Very difficult Extremely difficult Somewhat difficult          04/20/2023    2:27 PM 03/25/2023   12:56 PM 09/22/2022   11:19 AM 07/09/2022    1:02 PM 02/20/2022    2:29 PM  Depression screen PHQ 2/9  Decreased Interest 1 2 2 2 3   Down, Depressed, Hopeless 1 0 1 2 2   PHQ - 2 Score 2 2 3 4 5   Altered sleeping 2 0 0 2 1  Tired, decreased energy 3 3 2 2 3   Change in appetite 3 3 2 3 3   Feeling bad or failure about yourself  1 0 1 3 2   Trouble concentrating 3 3 2 3 2   Moving slowly or fidgety/restless 3 3 3 3 3   Suicidal thoughts 0 0 0 0 0  PHQ-9 Score 17 14 13 20 19   Difficult doing work/chores Very difficult Extremely dIfficult Somewhat difficult Somewhat difficult Very difficult    Assessment/Plan: Anxiety associated with depression - Plan: nortriptyline (PAMELOR) 75 MG capsule  PTSD (post-traumatic stress disorder) - Plan: nortriptyline (PAMELOR) 75 MG capsule  Reviewed notes from other provider and blood work results.  Hemoglobin A1c slightly increased.  Patient is trying to lose weight, walking and also paying attention to her general physical health.  Her PTSD symptoms are chronic and seeing a therapist Helmut Muster every week helps but still have flares up.  She is comfortable with nortriptyline 75 mg.  Encouraged to keep appointment with therapist, neurologist and she is considering to see a eye doctor for her peripheral vision.  Since started going every day with her sister-in-law who does [employer] eat is very helpful.  I also review her PHQ and anxiety screening and numbers are improved. Recommend to continue current medication and follow-up 3 months.   Follow Up Instructions:     I discussed the assessment and treatment plan with the patient. The patient was provided an opportunity to ask questions and all were answered. The patient agreed with the plan and demonstrated an understanding of the  instructions.   The patient was advised to call back or seek an in-person evaluation if the symptoms worsen or if the condition fails to improve as anticipated.  Collaboration of Care: Other provider involved in patient's care AEB notes are available in epic to review  Patient/Guardian was advised Release of Information must be obtained prior to any record release in order to collaborate their care with an outside provider. Patient/Guardian was advised if they have not already done so to contact the registration department to sign all necessary forms in order for [place] to release information regarding their care.   Consent: Patient/Guardian gives verbal consent for treatment and assignment of benefits for services provided during this visit. Patient/Guardian expressed understanding and agreed to proceed.     I provided 28 minutes of non face to face time during this encounter.  Note: This document was prepared by [employer] voice dictation technology and any errors that results from this process are unintentional.    Cleotis Nipper, MD 04/27/2023

## 2023-05-01 ENCOUNTER — Other Ambulatory Visit (HOSPITAL_COMMUNITY): Payer: Self-pay

## 2023-05-01 ENCOUNTER — Telehealth: Payer: Self-pay

## 2023-05-01 NOTE — Telephone Encounter (Signed)
 Have we gotten a PA request for Marcelline Deist?

## 2023-05-01 NOTE — Telephone Encounter (Signed)
 Pharmacy Patient Advocate Encounter   Received notification from Patient Advice Request messages that prior authorization for Farxiga 5MG  tablets is required/requested.   Insurance verification completed.   The patient is insured through [facility] .   Per test claim: PA required; PA submitted to above mentioned insurance via CoverMyMeds Key/confirmation #/EOC [id number] Status is pending

## 2023-05-01 NOTE — Telephone Encounter (Addendum)
 PA request has been Submitted. New Encounter has been or will be created for follow up. For additional info see Pharmacy Prior Auth telephone encounter from 05/01/2023.

## 2023-05-01 NOTE — Telephone Encounter (Signed)
 Pharmacy Patient Advocate Encounter  Received notification from [facility] that Prior Authorization for Marcelline Deist has been APPROVED from 04/17/2023 to 04/30/2024   PA #/Case ID/Reference #: [id number]

## 2023-05-07 ENCOUNTER — Telehealth: Payer: Self-pay | Admitting: *Deleted

## 2023-05-07 ENCOUNTER — Other Ambulatory Visit: Payer: Self-pay | Admitting: *Deleted

## 2023-05-07 DIAGNOSIS — Z87891 Personal history of nicotine dependence: Secondary | ICD-10-CM

## 2023-05-07 DIAGNOSIS — F17210 Nicotine dependence, cigarettes, uncomplicated: Secondary | ICD-10-CM

## 2023-05-07 DIAGNOSIS — Z122 Encounter for screening for malignant neoplasm of respiratory organs: Secondary | ICD-10-CM

## 2023-05-07 NOTE — Telephone Encounter (Signed)
 Lung Cancer Screening Narrative/Criteria Questionnaire (Cigarette Smokers Only- No Cigars/Pipes/vapes)   Maria Bailey   SDMV:06/09/23 4:00- Sarah                                           10-23-1970              LDCT: 06/10/23 1:00- MCA    53 y.o.   Phone: [phone]  Lung Screening Narrative (confirm age 1-77 yrs Medicare / 50-80 yrs Private pay insurance)   Insurance information:[employer]   Referring Provider:Beck   This screening involves an initial phone call with a team member from our program. It is called a shared decision making visit. The initial meeting is required by insurance and Medicare to make sure you understand the program. This appointment takes about 15-20 minutes to complete. The CT scan will completed at a separate date/time. This scan takes about 5-10 minutes to complete and you may eat and drink before and after the scan.  Criteria questions for Lung Cancer Screening:   Are you a current or former smoker? Current Age began smoking: 74   If you are a former smoker, what year did you quit smoking? (within 15 yrs)   To calculate your smoking history, I need an accurate estimate of how many packs of cigarettes you smoked per day and for how many years. (Not just the number of PPD you are now smoking)   Years smoking 36 x Packs per day 1-2 = Pack years 53   (at least 20 pack yrs)   (Make sure they understand that we need to know how much they have smoked in the past, not just the number of PPD they are smoking now)  Do you have a personal history of cancer?  No    Do you have a family history of cancer? Yes  (cancer type and and relative) MGF and MGM ( unknown kind)  Are you coughing up blood?  No  Have you had unexplained weight loss of 15 lbs or more in the last 6 months? No  It looks like you meet all criteria.     Additional information: N/A

## 2023-05-14 ENCOUNTER — Encounter: Payer: Self-pay | Admitting: Family Medicine

## 2023-05-15 ENCOUNTER — Other Ambulatory Visit (HOSPITAL_COMMUNITY): Payer: Self-pay

## 2023-05-20 ENCOUNTER — Ambulatory Visit (INDEPENDENT_AMBULATORY_CARE_PROVIDER_SITE_OTHER): Admitting: Family Medicine

## 2023-05-20 ENCOUNTER — Encounter: Payer: Self-pay | Admitting: Family Medicine

## 2023-05-20 ENCOUNTER — Ambulatory Visit (HOSPITAL_BASED_OUTPATIENT_CLINIC_OR_DEPARTMENT_OTHER)
Admission: RE | Admit: 2023-05-20 | Discharge: 2023-05-20 | Disposition: A | Discharge status: Home / Self Care | Source: Ambulatory Visit | Attending: Family Medicine | Admitting: Family Medicine

## 2023-05-20 VITALS — BP 120/70 | HR 93 | Temp 98.2°F | Ht 62.0 in | Wt 219.0 lb

## 2023-05-20 DIAGNOSIS — R052 Subacute cough: Secondary | ICD-10-CM | POA: Diagnosis not present

## 2023-05-20 MED ORDER — BENZONATATE 100 MG PO CAPS: 100.0000 mg | ORAL_CAPSULE | Freq: Three times a day (TID) | ORAL | 1 refills | Status: DC | PRN

## 2023-05-20 NOTE — Patient Instructions (Addendum)
 We believe you have a chronic/cyclical cough that is aggravated by reflux, coughing, and drainage. Xray today to confirm no infectious etiology. Goal is to not cough or clear your throat.  Avoid coughing or clearing throat by using non-mint. products/sugarless candy, water, ice chips. Remember NO MINT PRODUCTS Mucinex DM 1-2 every 12 hrs or Delsym 2 tsp every 12 hrs for cough Tessalon Three times a day as needed for cough.  Continue daily Protonix  Zyrtec 10mg  at bedtime Add Flonase or Nasacort for spring allergies  Smoking cessation encouraged

## 2023-05-20 NOTE — Progress Notes (Signed)
 Acute Office Visit  Subjective:     Patient ID: Maria Bailey, female    DOB: 02/18/1970, 53 y.o.   MRN: [id number]  Chief Complaint  Patient presents with   Cough    HPI Patient is in today for cough.  Discussed the use of AI scribe software for clinical note transcription with the patient, who gave verbal consent to proceed.  History of Present Illness BRANDIN DILDAY is a 53 year old female who presents with a persistent cough following a sinus infection and bronchitis.  She was treated for a sinus infection and bronchitis approximately one month ago. While the sinus pressure and rhinorrhea have mostly resolved, she continues to experience a persistent cough. The cough is both dry and productive, with dryness during the day and mucus production at night, ongoing for about six weeks total now.  The cough is sometimes so violent that it leads to urinary incontinence. It occurs frequently, about three to four times an hour, but there are periods where it subsides for an hour. No current dyspnea or chest pain, although the cough can be intense and difficult to stop once it starts.  She has a history of springtime allergies, which she manages with Benadryl at night, sometimes increasing the dose to two pills if exposed to allergens like blowing dust. She does not currently use nasal sprays like Flonase. She also takes Mucinex and has used [employer] in the past, but does not have any currently.  She has a history of reflux and heartburn, for which she takes Protonix.   She smokes and acknowledges the need to quit smoking, recognizing it as a potential contributing factor to her symptoms.      ROS All review of systems negative except what is listed in the HPI      Objective:    BP 120/70   Pulse 93   Temp 98.2 F (36.8 C) (Oral)   Ht 5\' 2"  (1.575 m)   Wt 219 lb (99.3 kg)   LMP 10/04/2021 (Approximate)   SpO2 98%   BMI 40.06 kg/m    Physical Exam Vitals  reviewed.  Constitutional:      Appearance: Normal appearance. She is obese.  Cardiovascular:     Rate and Rhythm: Normal rate and regular rhythm.     Heart sounds: Normal heart sounds.  Pulmonary:     Effort: Pulmonary effort is normal.     Breath sounds: Normal breath sounds. No wheezing, rhonchi or rales.  Skin:    General: Skin is warm and dry.  Neurological:     Mental Status: She is alert and oriented to person, place, and time.  Psychiatric:        Mood and Affect: Mood normal.        Behavior: Behavior normal.        Thought Content: Thought content normal.        Judgment: Judgment normal.        No results found for any visits on 05/20/23.      Assessment & Plan:   Problem List Items Addressed This Visit   None Visit Diagnoses       Subacute cough    -  Primary   Relevant Medications   benzonatate (TESSALON) 100 MG capsule   Other Relevant Orders   DG Chest 2 View      Consider chronic/cyclical cough that is aggravated by reflux, coughing, and drainage.  Xray today to confirm no infectious etiology. Goal  is to not cough or clear your throat.  Avoid coughing or clearing throat by using non-mint. products/sugarless candy, water, ice chips. Remember NO MINT PRODUCTS Mucinex DM 1-2 every 12 hrs or Delsym 2 tsp every 12 hrs for cough Tessalon Three times a day as needed for cough.  Continue daily Protonix  Zyrtec 10mg  at bedtime. Try to minimize benadryl.  Add Flonase or Nasacort for spring allergies  Smoking cessation encouraged    Meds ordered this encounter  Medications   benzonatate (TESSALON) 100 MG capsule    Sig: Take 1 capsule (100 mg total) by mouth 3 (three) times daily as needed for cough.    Dispense:  60 capsule    Refill:  1    Supervising Provider:   Randie Bustle A [[id number]]    Return if symptoms worsen or fail to improve.  Everlina Hock, NP

## 2023-06-09 ENCOUNTER — Encounter: Payer: Self-pay | Admitting: Acute Care

## 2023-06-09 ENCOUNTER — Ambulatory Visit: Admitting: Acute Care

## 2023-06-09 DIAGNOSIS — F17210 Nicotine dependence, cigarettes, uncomplicated: Secondary | ICD-10-CM

## 2023-06-09 NOTE — Progress Notes (Signed)
 Virtual Visit via Telephone Note  I connected with Maria Bailey on 06/09/23 at  4:00 PM EDT by telephone and verified that I am speaking with the correct person using two identifiers.  Location: Patient:  At home Provider: [address], [place], [place], [address]    I discussed the limitations, risks, security and privacy concerns of performing an evaluation and management service by telephone and the availability of in person appointments. I also discussed with the patient that there may be a patient responsible charge related to this service. The patient expressed understanding and agreed to proceed.    Shared Decision Making Visit Lung Cancer Screening Program [phone])   Eligibility: Age 7 y.o. Pack Years Smoking History Calculation 52 pack year smoking history (# packs/per year x # years smoked) Recent History of coughing up blood  no Unexplained weight loss? no ( >Than 15 pounds within the last 6 months ) Prior History Lung / other cancer no (Diagnosis within the last 5 years already requiring surveillance chest CT Scans). Smoking Status Current Smoker Former Smokers: Years since quit: NA  Quit Date:  NA  Visit Components: Discussion included one or more decision making aids. yes Discussion included risk/benefits of screening. yes Discussion included potential follow up diagnostic testing for abnormal scans. yes Discussion included meaning and risk of over diagnosis. yes Discussion included meaning and risk of False Positives. yes Discussion included meaning of total radiation exposure. yes  Counseling Included: Importance of adherence to annual lung cancer LDCT screening. yes Impact of comorbidities on ability to participate in the program. yes Ability and willingness to under diagnostic treatment. yes  Smoking Cessation Counseling: Current Smokers:  Discussed importance of smoking cessation. yes Information about tobacco cessation classes and  interventions provided to patient. yes Patient provided with "ticket" for LDCT Scan. yes Symptomatic Patient. no  Counseling(Intermediate counseling: > three minutes) 99406 Diagnosis Code: Tobacco Use Z72.0 Asymptomatic Patient yes  Counseling (Intermediate counseling: > three minutes counseling) Q6578 Former Smokers:  Discussed the importance of maintaining cigarette abstinence. yes Diagnosis Code: Personal History of Nicotine Dependence. I69.629 Information about tobacco cessation classes and interventions provided to patient. Yes Patient provided with "ticket" for LDCT Scan. NA Written Order for Lung Cancer Screening with LDCT placed in Epic. Yes (CT Chest Lung Cancer Screening Low Dose W/O CM) [id number] Z12.2-Screening of respiratory organs Z87.891-Personal history of nicotine dependence  Smoking more since she is not working. Smoking cessation counseling x 3-4 minutes   Raejean Bullock, NP 06/09/2023

## 2023-06-09 NOTE — Patient Instructions (Signed)

## 2023-06-10 ENCOUNTER — Ambulatory Visit (HOSPITAL_BASED_OUTPATIENT_CLINIC_OR_DEPARTMENT_OTHER)
Admission: RE | Admit: 2023-06-10 | Discharge: 2023-06-10 | Disposition: A | Discharge status: Home / Self Care | Source: Ambulatory Visit | Attending: Acute Care | Admitting: Acute Care

## 2023-06-10 DIAGNOSIS — F17210 Nicotine dependence, cigarettes, uncomplicated: Secondary | ICD-10-CM

## 2023-06-10 DIAGNOSIS — Z122 Encounter for screening for malignant neoplasm of respiratory organs: Secondary | ICD-10-CM | POA: Diagnosis not present

## 2023-06-10 DIAGNOSIS — Z87891 Personal history of nicotine dependence: Secondary | ICD-10-CM

## 2023-07-01 ENCOUNTER — Encounter: Payer: Self-pay | Admitting: Family Medicine

## 2023-07-01 MED ORDER — LANCET DEVICE MISC: 1.0000 | Freq: Three times a day (TID) | 0 refills | Status: AC

## 2023-07-01 MED ORDER — LANCETS MISC. MISC: 1.0000 | Freq: Three times a day (TID) | 0 refills | Status: AC

## 2023-07-01 MED ORDER — BLOOD GLUCOSE MONITORING SUPPL DEVI: 1.0000 | Freq: Three times a day (TID) | 0 refills | Status: DC

## 2023-07-01 MED ORDER — BLOOD GLUCOSE TEST VI STRP: 1.0000 | ORAL_STRIP | Freq: Three times a day (TID) | 0 refills | Status: AC

## 2023-07-06 ENCOUNTER — Other Ambulatory Visit: Payer: Self-pay | Admitting: Acute Care

## 2023-07-06 DIAGNOSIS — Z122 Encounter for screening for malignant neoplasm of respiratory organs: Secondary | ICD-10-CM

## 2023-07-06 DIAGNOSIS — Z87891 Personal history of nicotine dependence: Secondary | ICD-10-CM

## 2023-07-06 DIAGNOSIS — F17210 Nicotine dependence, cigarettes, uncomplicated: Secondary | ICD-10-CM

## 2023-07-14 ENCOUNTER — Other Ambulatory Visit: Payer: Self-pay | Admitting: Family Medicine

## 2023-07-14 ENCOUNTER — Encounter: Payer: Self-pay | Admitting: Family Medicine

## 2023-07-14 DIAGNOSIS — M778 Other enthesopathies, not elsewhere classified: Secondary | ICD-10-CM

## 2023-07-15 ENCOUNTER — Telehealth (HOSPITAL_COMMUNITY): Admitting: Psychiatry

## 2023-07-27 ENCOUNTER — Other Ambulatory Visit (HOSPITAL_COMMUNITY): Payer: Self-pay | Admitting: Psychiatry

## 2023-07-27 ENCOUNTER — Other Ambulatory Visit: Payer: Self-pay | Admitting: Family Medicine

## 2023-07-27 DIAGNOSIS — F4310 Post-traumatic stress disorder, unspecified: Secondary | ICD-10-CM

## 2023-07-27 DIAGNOSIS — F418 Other specified anxiety disorders: Secondary | ICD-10-CM

## 2023-07-29 ENCOUNTER — Other Ambulatory Visit (HOSPITAL_COMMUNITY): Payer: Self-pay | Admitting: *Deleted

## 2023-07-29 ENCOUNTER — Encounter (HOSPITAL_COMMUNITY): Payer: Self-pay

## 2023-07-29 DIAGNOSIS — F4310 Post-traumatic stress disorder, unspecified: Secondary | ICD-10-CM

## 2023-07-29 DIAGNOSIS — F418 Other specified anxiety disorders: Secondary | ICD-10-CM

## 2023-07-29 MED ORDER — NORTRIPTYLINE HCL 75 MG PO CAPS: 75.0000 mg | ORAL_CAPSULE | Freq: Every day | ORAL | 0 refills | Status: DC

## 2023-07-29 NOTE — Telephone Encounter (Signed)
 Good afternoon Maria Bailey. We have sent a partial prescription to your pharmacy and Dr. Arfeen will refill medications at your next appointment. Take care.

## 2023-08-10 ENCOUNTER — Encounter (HOSPITAL_COMMUNITY): Payer: Self-pay | Admitting: Psychiatry

## 2023-08-10 ENCOUNTER — Telehealth (HOSPITAL_BASED_OUTPATIENT_CLINIC_OR_DEPARTMENT_OTHER): Admitting: Psychiatry

## 2023-08-10 DIAGNOSIS — F4310 Post-traumatic stress disorder, unspecified: Secondary | ICD-10-CM

## 2023-08-10 DIAGNOSIS — F418 Other specified anxiety disorders: Secondary | ICD-10-CM

## 2023-08-10 MED ORDER — NORTRIPTYLINE HCL 75 MG PO CAPS: 75.0000 mg | ORAL_CAPSULE | Freq: Every day | ORAL | 2 refills | Status: DC
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
## 2023-08-26 DIAGNOSIS — F4323 Adjustment disorder with mixed anxiety and depressed mood: Secondary | ICD-10-CM | POA: Insufficient documentation

## 2023-08-27 ENCOUNTER — Other Ambulatory Visit: Payer: Self-pay | Admitting: Family Medicine

## 2023-09-03 ENCOUNTER — Encounter (HOSPITAL_COMMUNITY): Payer: Self-pay
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
## 2023-09-16 ENCOUNTER — Encounter: Payer: Self-pay | Admitting: Family Medicine

## 2023-09-16 ENCOUNTER — Encounter (HOSPITAL_COMMUNITY): Payer: Self-pay

## 2023-09-25 ENCOUNTER — Other Ambulatory Visit: Payer: Self-pay | Admitting: Family Medicine

## 2023-09-25 DIAGNOSIS — K219 Gastro-esophageal reflux disease without esophagitis: Secondary | ICD-10-CM

## 2023-10-15 ENCOUNTER — Other Ambulatory Visit (HOSPITAL_COMMUNITY)
Admission: RE | Admit: 2023-10-15 | Discharge: 2023-10-15 | Disposition: A | Discharge status: Home / Self Care | Source: Ambulatory Visit | Attending: Family Medicine | Admitting: Family Medicine

## 2023-10-15 ENCOUNTER — Ambulatory Visit (INDEPENDENT_AMBULATORY_CARE_PROVIDER_SITE_OTHER): Admitting: Family Medicine

## 2023-10-15 VITALS — BP 133/62 | HR 91 | Ht 62.0 in | Wt 214.1 lb

## 2023-10-15 DIAGNOSIS — Z01419 Encounter for gynecological examination (general) (routine) without abnormal findings: Secondary | ICD-10-CM | POA: Insufficient documentation

## 2023-10-15 NOTE — Progress Notes (Signed)
 ANNUAL EXAM Patient name: Maria Bailey MRN [id number]  Date of birth: [birth year] Chief Complaint:   Annual Exam  History of Present Illness:   Maria Bailey is a 53 y.o.  No obstetric history on file.  female  being seen today for a routine annual exam.  Current complaints: none. menopausal  Patient's last menstrual period was 10/04/2021 (approximate).    Last pap years ago. No abnormal PAP Last mammogram: 2024. Results were: normal. Family h/o breast cancer: no Last colonoscopy: due     10/15/2023    2:53 PM 04/27/2023    2:52 PM 04/20/2023    2:27 PM 03/25/2023   12:56 PM 09/22/2022   11:19 AM  Depression screen PHQ 2/9  Decreased Interest 2  1 2 2   Down, Depressed, Hopeless 1  1 0 1  PHQ - 2 Score 3  2 2 3   Altered sleeping 2  2 0 0  Tired, decreased energy 2  3 3 2   Change in appetite 2  3 3 2   Feeling bad or failure about yourself  1  1 0 1  Trouble concentrating 2  3 3 2   Moving slowly or fidgety/restless 1  3 3 3   Suicidal thoughts 0  0 0 0  PHQ-9 Score 13  17 14 13   Difficult doing work/chores   Very difficult Extremely dIfficult Somewhat difficult     Information is confidential and restricted. Go to Review Flowsheets to unlock data.        10/15/2023    2:53 PM 04/27/2023    2:55 PM 04/20/2023    2:28 PM 03/25/2023   12:58 PM  GAD 7 : Generalized Anxiety Score  Nervous, Anxious, on Edge 2  3 3   Control/stop worrying 1  3 3   Worry too much - different things 2  3 0  Trouble relaxing 3  2 2   Restless 2  3 0  Easily annoyed or irritable 3  3 3   Afraid - awful might happen 3  2 3   Total GAD 7 Score 16  19 14   Anxiety Difficulty   Very difficult Extremely difficult     Information is confidential and restricted. Go to Review Flowsheets to unlock data.     Review of Systems:   Pertinent items are noted in HPI Denies any headaches, blurred vision, fatigue, shortness of breath, chest pain, abdominal pain, abnormal vaginal discharge/itching/odor/irritation,  problems with periods, bowel movements, urination, or intercourse unless otherwise stated above. Pertinent History Reviewed:  Reviewed past medical,surgical, social and family history.  Reviewed problem list, medications and allergies. Physical Assessment:   Vitals:   10/15/23 1458  BP: 133/62  Pulse: 91  Weight: 214 lb 1.3 oz (97.1 kg)  Height: 5' 2 (1.575 m)  Body mass index is 39.16 kg/m.        Physical Examination:   General appearance - well appearing, and in no distress  Mental status - alert, oriented to person, place, and time  Psych:  She has a normal mood and affect  Skin - warm and dry, normal color, no suspicious lesions noted  Chest - effort normal, all lung fields clear to auscultation bilaterally  Heart - normal rate and regular rhythm  Neck:  midline trachea, no thyromegaly or nodules  Breasts - breasts appear normal, no suspicious masses, no skin or nipple changes or axillary nodes  Abdomen - soft, nontender, nondistended, no masses or organomegaly  Pelvic - VULVA: normal appearing vulva with no  masses, tenderness or lesions  VAGINA: normal appearing vagina with normal color and discharge, no lesions  CERVIX: normal appearing cervix without discharge or lesions, no CMT  Thin prep pap is done with HR HPV cotesting  UTERUS: uterus is felt to be normal size, shape, consistency and nontender   ADNEXA: No adnexal masses or tenderness noted.  Extremities:  No swelling or varicosities noted  Chaperone present for exam  Assessment & Plan:  1. Encounter for well woman exam with routine gynecological exam (Primary) PAP today Will schedule mammogram [facility].    No orders of the defined types were placed in this encounter.   Meds: No orders of the defined types were placed in this encounter.   Follow-up: No follow-ups on file.  Aimee Timmons J Aleksei Goodlin, DO 10/15/2023 3:58 PM

## 2023-10-20 LAB — CYTOLOGY - PAP
Adequacy: ABSENT
Comment: NEGATIVE
Diagnosis: NEGATIVE
High risk HPV: NEGATIVE

## 2023-10-23 ENCOUNTER — Ambulatory Visit: Payer: Self-pay | Admitting: Family Medicine

## 2023-10-23 DIAGNOSIS — R195 Other fecal abnormalities: Secondary | ICD-10-CM

## 2023-11-10 ENCOUNTER — Encounter (HOSPITAL_COMMUNITY): Payer: Self-pay | Admitting: Psychiatry

## 2023-11-10 ENCOUNTER — Telehealth (HOSPITAL_COMMUNITY): Admitting: Psychiatry

## 2023-11-10 VITALS — Wt 214.0 lb

## 2023-11-10 DIAGNOSIS — F418 Other specified anxiety disorders: Secondary | ICD-10-CM

## 2023-11-10 DIAGNOSIS — F4310 Post-traumatic stress disorder, unspecified: Secondary | ICD-10-CM

## 2023-11-10 MED ORDER — NORTRIPTYLINE HCL 75 MG PO CAPS: 75.0000 mg | ORAL_CAPSULE | Freq: Every day | ORAL | 2 refills | Status: DC
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
## 2023-11-12 ENCOUNTER — Ambulatory Visit: Admitting: Family Medicine

## 2023-11-12 ENCOUNTER — Ambulatory Visit (HOSPITAL_BASED_OUTPATIENT_CLINIC_OR_DEPARTMENT_OTHER)
Admission: RE | Admit: 2023-11-12 | Discharge: 2023-11-12 | Disposition: A | Discharge status: Home / Self Care | Source: Ambulatory Visit | Attending: Family Medicine | Admitting: Family Medicine

## 2023-11-12 VITALS — BP 141/50 | HR 92 | Ht 62.0 in | Wt 215.0 lb

## 2023-11-12 DIAGNOSIS — G8929 Other chronic pain: Secondary | ICD-10-CM

## 2023-11-12 DIAGNOSIS — M5442 Lumbago with sciatica, left side: Secondary | ICD-10-CM | POA: Insufficient documentation

## 2023-11-12 MED ORDER — CYCLOBENZAPRINE HCL 5 MG PO TABS: 5.0000 mg | ORAL_TABLET | Freq: Two times a day (BID) | ORAL | 1 refills | Status: DC | PRN

## 2023-11-12 MED ORDER — NAPROXEN 500 MG PO TABS: 500.0000 mg | ORAL_TABLET | Freq: Two times a day (BID) | ORAL | 2 refills | Status: AC
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
## 2023-11-18 ENCOUNTER — Ambulatory Visit: Payer: Self-pay | Admitting: Family Medicine

## 2023-11-26 LAB — COLOGUARD: COLOGUARD: POSITIVE — AB

## 2023-12-06 ENCOUNTER — Other Ambulatory Visit: Payer: Self-pay | Admitting: Family Medicine

## 2023-12-06 DIAGNOSIS — G8929 Other chronic pain: Secondary | ICD-10-CM

## 2023-12-09 ENCOUNTER — Telehealth: Payer: Self-pay | Admitting: Family Medicine

## 2023-12-09 NOTE — Telephone Encounter (Signed)
 Copied from CRM [id number]. Topic: General - Other >> Dec 09, 2023  8:20 AM Avram MATSU wrote: Reason for CRM: Stephania is calling to check if the office has received the form for incontinence supplies. Please advise [phone]

## 2023-12-10 NOTE — Telephone Encounter (Unsigned)
 Copied from CRM [id number]. Topic: General - Other >> Dec 09, 2023  8:20 AM Avram MATSU wrote: Reason for CRM: Stephania is calling to check if the office has received the form for incontinence supplies. Please advise [phone] >> Dec 10, 2023  2:10 PM Robinson DEL wrote: Adria with [facility] calling to check status of order for incontinence supplies for patient on 11/4. Adria will be re-faxing order now.  [facility] [phone]

## 2023-12-10 NOTE — Telephone Encounter (Signed)
 Have not received order. Not in my S-Drive at the moment, is it somewhere waiting to be sorted?

## 2023-12-10 NOTE — Telephone Encounter (Signed)
 Not in S-drive, Waddell have you seen a request?

## 2023-12-11 NOTE — Telephone Encounter (Signed)
 Called to let them know that forms have not been received. Gave email to send forms. Verified they did have our correct fax number.

## 2023-12-11 NOTE — Telephone Encounter (Signed)
 We do not have anything in the faxes at this time. When we get something we will place in the S-drive.

## 2023-12-16 ENCOUNTER — Encounter: Payer: Self-pay | Admitting: Family Medicine

## 2024-01-08 ENCOUNTER — Telehealth

## 2024-01-08 DIAGNOSIS — B372 Candidiasis of skin and nail: Secondary | ICD-10-CM | POA: Diagnosis not present

## 2024-01-08 MED ORDER — CLOTRIMAZOLE 1 % EX CREA: TOPICAL_CREAM | CUTANEOUS | 0 refills | Status: AC

## 2024-01-08 NOTE — Progress Notes (Signed)
 Virtual Visit Consent   Maria Bailey, you are scheduled for a virtual visit with a [facility] provider today. Just as with appointments in the office, your consent must be obtained to participate. Your consent will be active for this visit and any virtual visit you may have with one of our providers in the next 365 days. If you have a MyChart account, a copy of this consent can be sent to you electronically.  As this is a virtual visit, video technology does not allow for your provider to perform a traditional examination. This may limit your provider's ability to fully assess your condition. If your provider identifies any concerns that need to be evaluated in person or the need to arrange testing (such as labs, EKG, etc.), we will make arrangements to do so. Although advances in technology are sophisticated, we cannot ensure that it will always work on either your end or our end. If the connection with a video visit is poor, the visit may have to be switched to a telephone visit. With either a video or telephone visit, we are not always able to ensure that we have a secure connection.  By engaging in this virtual visit, you consent to the provision of healthcare and authorize for your insurance to be billed (if applicable) for the services provided during this visit. Depending on your insurance coverage, you may receive a charge related to this service.  I need to obtain your verbal consent now. Are you willing to proceed with your visit today? [name] has provided verbal consent on 01/08/2024 for a virtual visit (video or telephone). Lamar Schlossman, PA-C  Date: 01/08/2024 9:21 AM   Virtual Visit via Video Note   I, Lamar Schlossman, connected with  [name]  ([id number], 1970-06-11) on 01/08/24 at  9:15 AM EST by a video-enabled telemedicine application and verified that I am speaking with the correct person using two identifiers.  Location: Patient: Virtual Visit Location Patient:  Home Provider: Virtual Visit Location Provider: Home Office   I discussed the limitations of evaluation and management by telemedicine and the availability of in person appointments. The patient expressed understanding and agreed to proceed.    History of Present Illness: Maria Bailey is a 53 y.o. who identifies as a female who was assigned female at birth, and is being seen today for rash under her armpit.  Has been going on for the past 2 days.  She states that it is located just under the left arm.  She states that it seems moist and it burns.  She denies symptoms anywhere else.  Hasn't tried anything for it.  Denies any other associated symptoms.  HPI: HPI  Problems:  Patient Active Problem List   Diagnosis Date Noted   Adjustment disorder with mixed anxiety and depressed mood 08/26/2023   Gastroesophageal reflux disease 03/25/2023   PTSD (post-traumatic stress disorder) 04/21/2022   Hyperlipidemia 03/24/2022   DM (diabetes mellitus) (HCC) 03/24/2022   Capsulitis of right shoulder 03/17/2022   Acquired stuttering 02/14/2022   Anxiety 02/14/2022   Dysphonia 12/31/2021   Carpal tunnel syndrome, right 09/11/2020   Concussion 07/23/2018    Allergies:  Allergies  Allergen Reactions   Amoxicillin Anaphylaxis and Hives   Penicillins Anaphylaxis and Hives    Has patient had a PCN reaction causing immediate rash, facial/tongue/throat swelling, SOB or lightheadedness with hypotension: Yes Has patient had a PCN reaction causing severe rash involving mucus membranes or skin necrosis: No Has  patient had a PCN reaction that required hospitalization 10 hr hospital visit Has patient had a PCN reaction occurring within the last 10 years: No If all of the above answers are NO, then may proceed with Cephalosporin use.   Prednisone Anaphylaxis   Tramadol     Other Rash and Other (See Comments)    Steroids cause palpitations   Medications:  Current Outpatient Medications:    clotrimazole   (LOTRIMIN ) 1 % cream, Apply to rash under arm morning and evening for 10 days., Disp: 15 g, Rfl: 0   Accu-Chek Softclix Lancets lancets, USE TO TEST BLOOD GLUCOSE 3 TIMES DAILY-MORNING, NOON & AT BEDTIME, Disp: 300 each, Rfl: 0   Blood Glucose Monitoring Suppl (ACCU-CHEK GUIDE ME) w/Device KIT, USE AS DIRECTED, Disp: 1 kit, Rfl: 0   cholecalciferol (VITAMIN D3) 25 MCG (1000 UNIT) tablet, Take 1,000 Units by mouth daily., Disp: , Rfl:    Coenzyme Q10 (COQ-10) 100 MG CAPS, Take by mouth., Disp: , Rfl:    cyclobenzaprine  (FLEXERIL ) 5 MG tablet, TAKE 1 TABLET BY MOUTH 2 TIMES DAILY AS NEEDED FOR MUSCLE SPASMS., Disp: 30 tablet, Rfl: 1   dapagliflozin  propanediol (FARXIGA ) 5 MG TABS tablet, Take 1 tablet (5 mg total) by mouth daily., Disp: 90 tablet, Rfl: 3   diphenhydramine-acetaminophen  (TYLENOL  PM) 25-500 MG TABS tablet, Take 1 tablet by mouth at bedtime as needed., Disp: , Rfl:    gabapentin  (NEURONTIN ) 100 MG capsule, Take 200 mg by mouth 3 (three) times daily., Disp: , Rfl:    glucose blood (ACCU-CHEK GUIDE TEST) test strip, Use as instructed, Disp: 100 each, Rfl: 12   Multiple Vitamin (MULTIVITAMIN WITH MINERALS) TABS tablet, Take 1 tablet by mouth daily., Disp: , Rfl:    naproxen  (NAPROSYN ) 500 MG tablet, Take 1 tablet (500 mg total) by mouth 2 (two) times daily with a meal. As needed for pain, Disp: 60 tablet, Rfl: 2   nortriptyline  (PAMELOR ) 75 MG capsule, Take 1 capsule (75 mg total) by mouth at bedtime., Disp: 30 capsule, Rfl: 2   Omega-3 Fatty Acids (FISH OIL) 1000 MG CAPS, Take 2,000 mg by mouth daily., Disp: , Rfl:    pantoprazole  (PROTONIX ) 40 MG tablet, TAKE 1 TABLET BY MOUTH EVERY DAY, Disp: 90 tablet, Rfl: 1  Observations/Objective: Patient is well-developed, well-nourished in no acute distress.  Resting comfortably at home.  Head is normocephalic, atraumatic.  No labored breathing.  Speech is clear and coherent with logical content.  Patient is alert and oriented at baseline.   Mild erythema to left axilla, no pustules, vesicles, or evidence of abscess  Assessment and Plan: There are no diagnoses linked to this encounter.  Meds ordered this encounter  Medications   clotrimazole  (LOTRIMIN ) 1 % cream    Sig: Apply to rash under arm morning and evening for 10 days.    Dispense:  15 g    Refill:  0    Supervising Provider:   BLAISE ALEENE KIDD [id number]   Concern for yeast infection due to beefy redness and moisture.  No evidence to suggest abscess.  No fever.  Doubt cellulitis.  Will try lotrimin  and baby powder to keep it dry.  Return precautions discussed.  Follow Up Instructions: I discussed the assessment and treatment plan with the patient. The patient was provided an opportunity to ask questions and all were answered. The patient agreed with the plan and demonstrated an understanding of the instructions.  A copy of instructions were sent to the patient via MyChart unless  otherwise noted below.     The patient was advised to call back or seek an in-person evaluation if the symptoms worsen or if the condition fails to improve as anticipated.    Lamar Schlossman, PA-C

## 2024-01-08 NOTE — Patient Instructions (Signed)
 Maria Bailey, thank you for joining Lamar Schlossman, PA-C for today's virtual visit.  While this provider is not your primary care provider (PCP), if your PCP is located in our provider database this encounter information will be shared with them immediately following your visit.   A [facility] MyChart account gives you access to today's visit and all your visits, tests, and labs performed at [facility]  click here if you don't have a [facility] MyChart account or go to [email or web address]  Consent: (Patient) Maria Bailey provided verbal consent for this virtual visit at the beginning of the encounter.  Current Medications:  Current Outpatient Medications:    clotrimazole  (LOTRIMIN ) 1 % cream, Apply to rash under arm morning and evening for 10 days., Disp: 15 g, Rfl: 0   Accu-Chek Softclix Lancets lancets, USE TO TEST BLOOD GLUCOSE 3 TIMES DAILY-MORNING, NOON & AT BEDTIME, Disp: 300 each, Rfl: 0   Blood Glucose Monitoring Suppl (ACCU-CHEK GUIDE ME) w/Device KIT, USE AS DIRECTED, Disp: 1 kit, Rfl: 0   cholecalciferol (VITAMIN D3) 25 MCG (1000 UNIT) tablet, Take 1,000 Units by mouth daily., Disp: , Rfl:    Coenzyme Q10 (COQ-10) 100 MG CAPS, Take by mouth., Disp: , Rfl:    cyclobenzaprine  (FLEXERIL ) 5 MG tablet, TAKE 1 TABLET BY MOUTH 2 TIMES DAILY AS NEEDED FOR MUSCLE SPASMS., Disp: 30 tablet, Rfl: 1   dapagliflozin  propanediol (FARXIGA ) 5 MG TABS tablet, Take 1 tablet (5 mg total) by mouth daily., Disp: 90 tablet, Rfl: 3   diphenhydramine-acetaminophen  (TYLENOL  PM) 25-500 MG TABS tablet, Take 1 tablet by mouth at bedtime as needed., Disp: , Rfl:    gabapentin  (NEURONTIN ) 100 MG capsule, Take 200 mg by mouth 3 (three) times daily., Disp: , Rfl:    glucose blood (ACCU-CHEK GUIDE TEST) test strip, Use as instructed, Disp: 100 each, Rfl: 12   Multiple Vitamin (MULTIVITAMIN WITH MINERALS) TABS tablet, Take 1 tablet by mouth daily., Disp: , Rfl:    naproxen  (NAPROSYN ) 500 MG  tablet, Take 1 tablet (500 mg total) by mouth 2 (two) times daily with a meal. As needed for pain, Disp: 60 tablet, Rfl: 2   nortriptyline  (PAMELOR ) 75 MG capsule, Take 1 capsule (75 mg total) by mouth at bedtime., Disp: 30 capsule, Rfl: 2   Omega-3 Fatty Acids (FISH OIL) 1000 MG CAPS, Take 2,000 mg by mouth daily., Disp: , Rfl:    pantoprazole  (PROTONIX ) 40 MG tablet, TAKE 1 TABLET BY MOUTH EVERY DAY, Disp: 90 tablet, Rfl: 1   Medications ordered in this encounter:  Meds ordered this encounter  Medications   clotrimazole  (LOTRIMIN ) 1 % cream    Sig: Apply to rash under arm morning and evening for 10 days.    Dispense:  15 g    Refill:  0    Supervising Provider:   BLAISE ALEENE KIDD [[id number]]     *If you need refills on other medications prior to your next appointment, please contact your pharmacy*  Follow-Up: Call back or seek an in-person evaluation if the symptoms worsen or if the condition fails to improve as anticipated.  [facility] [phone]  Other Instructions   If you have been instructed to have an in-person evaluation today at a local Urgent Care facility, please use the link below. It will take you to a list of all of our available [facility], including address, phone number and hours of operation. Please do not delay care.  [facility]  [facility]  If you or a family member do not have a primary care provider, use the link below to schedule a visit and establish care. When you choose a [facility] primary care physician or advanced practice provider, you gain a long-term partner in health. Find a Primary Care Provider  Learn more about [facility]'s in-office and virtual care options: [facility] - Get Care Now

## 2024-02-04 ENCOUNTER — Other Ambulatory Visit (HOSPITAL_COMMUNITY): Payer: Self-pay | Admitting: Psychiatry

## 2024-02-04 DIAGNOSIS — F418 Other specified anxiety disorders: Secondary | ICD-10-CM

## 2024-02-04 DIAGNOSIS — F4310 Post-traumatic stress disorder, unspecified: Secondary | ICD-10-CM

## 2024-02-09 ENCOUNTER — Encounter (HOSPITAL_COMMUNITY): Payer: Self-pay | Admitting: Psychiatry

## 2024-02-09 ENCOUNTER — Telehealth (HOSPITAL_COMMUNITY): Admitting: Psychiatry

## 2024-02-09 VITALS — Wt 215.0 lb

## 2024-02-09 DIAGNOSIS — F4310 Post-traumatic stress disorder, unspecified: Secondary | ICD-10-CM | POA: Diagnosis not present

## 2024-02-09 DIAGNOSIS — F418 Other specified anxiety disorders: Secondary | ICD-10-CM | POA: Diagnosis not present

## 2024-02-09 MED ORDER — NORTRIPTYLINE HCL 75 MG PO CAPS: 75.0000 mg | ORAL_CAPSULE | Freq: Every day | ORAL | 2 refills | Status: AC
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
## 2024-02-12 ENCOUNTER — Other Ambulatory Visit: Payer: Self-pay | Admitting: Family Medicine

## 2024-02-12 DIAGNOSIS — G8929 Other chronic pain: Secondary | ICD-10-CM

## 2024-02-16 LAB — HM DIABETES EYE EXAM

## 2024-02-17 ENCOUNTER — Encounter: Payer: Self-pay | Admitting: Neurology

## 2024-02-17 ENCOUNTER — Encounter: Payer: Self-pay | Admitting: Family Medicine

## 2024-02-17 DIAGNOSIS — J309 Allergic rhinitis, unspecified: Secondary | ICD-10-CM

## 2024-02-19 MED ORDER — CETIRIZINE HCL 10 MG PO TABS: 10.0000 mg | ORAL_TABLET | Freq: Every day | ORAL | 3 refills | Status: AC

## 2024-03-10 ENCOUNTER — Encounter: Payer: Self-pay | Admitting: Family Medicine

## 2024-04-20 ENCOUNTER — Encounter: Admitting: Family Medicine

## 2024-05-09 ENCOUNTER — Telehealth (HOSPITAL_COMMUNITY): Admitting: Psychiatry
